# Patient Record
Sex: Female | Born: 1990 | Race: Black or African American | Hispanic: No | Smoking: Former smoker
Health system: Southern US, Community
[De-identification: ages and names within clinical notes are randomized; demographics above are authoritative.]

## PROBLEM LIST (undated history)

## (undated) ENCOUNTER — Inpatient Hospital Stay (HOSPITAL_COMMUNITY): Payer: Self-pay

## (undated) DIAGNOSIS — Z8759 Personal history of other complications of pregnancy, childbirth and the puerperium: Secondary | ICD-10-CM

## (undated) DIAGNOSIS — Z789 Other specified health status: Secondary | ICD-10-CM

## (undated) HISTORY — PX: THERAPEUTIC ABORTION: SHX798

## (undated) HISTORY — DX: Personal history of other complications of pregnancy, childbirth and the puerperium: Z87.59

---
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
## 2012-04-20 ENCOUNTER — Inpatient Hospital Stay (HOSPITAL_COMMUNITY)
Admission: AD | Admit: 2012-04-20 | Discharge: 2012-04-20 | Disposition: A | Discharge status: Home / Self Care | Payer: Medicaid Other | Source: Ambulatory Visit | Attending: Obstetrics | Admitting: Obstetrics

## 2012-04-20 DIAGNOSIS — R109 Unspecified abdominal pain: Secondary | ICD-10-CM | POA: Insufficient documentation

## 2012-04-20 DIAGNOSIS — O4690 Antepartum hemorrhage, unspecified, unspecified trimester: Secondary | ICD-10-CM | POA: Insufficient documentation

## 2012-04-20 LAB — URINALYSIS, ROUTINE W REFLEX MICROSCOPIC
Glucose, UA: NEGATIVE mg/dL
Nitrite: NEGATIVE
Protein, ur: NEGATIVE mg/dL
pH: 7 (ref 5.0–8.0)

## 2012-04-20 LAB — URINE MICROSCOPIC-ADD ON

## 2012-04-20 NOTE — MAU Note (Signed)
Pt reprots having pain and cramping in her back and now it is in her abd Pain comes and goes. Reports some vaginal bleeding as well

## 2012-04-21 ENCOUNTER — Encounter (HOSPITAL_COMMUNITY): Payer: Self-pay | Admitting: Anesthesiology

## 2012-04-21 ENCOUNTER — Inpatient Hospital Stay (HOSPITAL_COMMUNITY): Payer: Medicaid Other | Admitting: Anesthesiology

## 2012-04-21 ENCOUNTER — Encounter (HOSPITAL_COMMUNITY): Payer: Self-pay | Admitting: Obstetrics

## 2012-04-21 ENCOUNTER — Encounter (HOSPITAL_COMMUNITY): Payer: Self-pay | Admitting: *Deleted

## 2012-04-21 ENCOUNTER — Inpatient Hospital Stay (HOSPITAL_COMMUNITY)
Admission: AD | Admit: 2012-04-21 | Discharge: 2012-04-23 | DRG: 775 | Disposition: A | Discharge status: Home / Self Care | Payer: Medicaid Other | Source: Ambulatory Visit | Attending: Obstetrics | Admitting: Obstetrics

## 2012-04-21 LAB — COMPREHENSIVE METABOLIC PANEL
CO2: 22 mEq/L (ref 19–32)
Calcium: 8.8 mg/dL (ref 8.4–10.5)
Creatinine, Ser: 0.55 mg/dL (ref 0.50–1.10)
GFR calc Af Amer: >90 mL/min (ref >90)
GFR calc non Af Amer: >90 mL/min (ref >90)
Glucose, Bld: 116 mg/dL — ABNORMAL HIGH (ref 70–99)
Total Protein: 5.9 g/dL — ABNORMAL LOW (ref 6.0–8.3)

## 2012-04-21 LAB — CBC
HCT: 33.5 % — ABNORMAL LOW (ref 36.0–46.0)
HCT: 33.9 % — ABNORMAL LOW (ref 36.0–46.0)
Hemoglobin: 11.4 g/dL — ABNORMAL LOW (ref 12.0–15.0)
Hemoglobin: 11.2 g/dL — ABNORMAL LOW (ref 12.0–15.0)
MCH: 29.5 pg (ref 26.0–34.0)
MCH: 28.7 pg (ref 26.0–34.0)
MCHC: 34 g/dL (ref 30.0–36.0)
MCHC: 33 g/dL (ref 30.0–36.0)
MCV: 86.8 fL (ref 78.0–100.0)
MCV: 86.9 fL (ref 78.0–100.0)
Platelets: 220 10*3/uL (ref 150–400)
Platelets: 213 10*3/uL (ref 150–400)
RBC: 3.86 MIL/uL — ABNORMAL LOW (ref 3.87–5.11)
RBC: 3.9 MIL/uL (ref 3.87–5.11)
RDW: 13.4 % (ref 11.5–15.5)
RDW: 13.4 % (ref 11.5–15.5)
WBC: 12.1 10*3/uL — ABNORMAL HIGH (ref 4.0–10.5)
WBC: 12.8 10*3/uL — ABNORMAL HIGH (ref 4.0–10.5)

## 2012-04-21 LAB — URIC ACID: Uric Acid, Serum: 3.7 mg/dL (ref 2.4–7.0)

## 2012-04-21 LAB — RPR: RPR Ser Ql: NONREACTIVE

## 2012-04-21 MED ORDER — EPHEDRINE 5 MG/ML INJ
10.0000 mg | INTRAVENOUS | Status: DC | PRN
  Filled 2012-04-21: qty 2
  Filled 2012-04-21: qty 4

## 2012-04-21 MED ORDER — EPHEDRINE 5 MG/ML INJ
10.0000 mg | INTRAVENOUS | Status: DC | PRN
  Filled 2012-04-21: qty 2

## 2012-04-21 MED ORDER — OXYCODONE-ACETAMINOPHEN 5-325 MG PO TABS: 1.0000 | ORAL_TABLET | ORAL | Status: DC | PRN

## 2012-04-21 MED ORDER — DIPHENHYDRAMINE HCL 50 MG/ML IJ SOLN: 12.5000 mg | INTRAMUSCULAR | Status: DC | PRN

## 2012-04-21 MED ORDER — WITCH HAZEL-GLYCERIN EX PADS: 1.0000 "application " | MEDICATED_PAD | CUTANEOUS | Status: DC | PRN

## 2012-04-21 MED ORDER — ONDANSETRON HCL 4 MG/2ML IJ SOLN: 4.0000 mg | INTRAMUSCULAR | Status: DC | PRN

## 2012-04-21 MED ORDER — ONDANSETRON HCL 4 MG PO TABS: 4.0000 mg | ORAL_TABLET | ORAL | Status: DC | PRN

## 2012-04-21 MED ORDER — LACTATED RINGERS IV SOLN
INTRAVENOUS | Status: DC
  Administered 2012-04-21 (×2): via INTRAVENOUS

## 2012-04-21 MED ORDER — BENZOCAINE-MENTHOL 20-0.5 % EX AERO
1.0000 "application " | INHALATION_SPRAY | CUTANEOUS | Status: DC | PRN
  Filled 2012-04-21: qty 56

## 2012-04-21 MED ORDER — MEDROXYPROGESTERONE ACETATE 150 MG/ML IM SUSP: 150.0000 mg | INTRAMUSCULAR | Status: DC | PRN

## 2012-04-21 MED ORDER — LIDOCAINE HCL (PF) 1 % IJ SOLN
30.0000 mL | INTRAMUSCULAR | Status: DC | PRN
  Filled 2012-04-21 (×2): qty 30

## 2012-04-21 MED ORDER — ZOLPIDEM TARTRATE 5 MG PO TABS: 5.0000 mg | ORAL_TABLET | Freq: Every evening | ORAL | Status: DC | PRN

## 2012-04-21 MED ORDER — LANOLIN HYDROUS EX OINT: TOPICAL_OINTMENT | CUTANEOUS | Status: DC | PRN

## 2012-04-21 MED ORDER — SENNOSIDES-DOCUSATE SODIUM 8.6-50 MG PO TABS
2.0000 | ORAL_TABLET | Freq: Every day | ORAL | Status: DC
  Administered 2012-04-21 – 2012-04-22 (×2): 2 via ORAL

## 2012-04-21 MED ORDER — SIMETHICONE 80 MG PO CHEW: 80.0000 mg | CHEWABLE_TABLET | ORAL | Status: DC | PRN

## 2012-04-21 MED ORDER — IBUPROFEN 600 MG PO TABS
600.0000 mg | ORAL_TABLET | Freq: Four times a day (QID) | ORAL | Status: DC | PRN
  Administered 2012-04-21: 600 mg via ORAL
  Filled 2012-04-21: qty 1

## 2012-04-21 MED ORDER — OXYTOCIN 40 UNITS IN LACTATED RINGERS INFUSION - SIMPLE MED
62.5000 mL/h | INTRAVENOUS | Status: DC
  Filled 2012-04-21: qty 1000

## 2012-04-21 MED ORDER — PRENATAL MULTIVITAMIN CH
1.0000 | ORAL_TABLET | Freq: Every day | ORAL | Status: DC
  Administered 2012-04-22 – 2012-04-23 (×2): 1 via ORAL
  Filled 2012-04-21 (×2): qty 1

## 2012-04-21 MED ORDER — IBUPROFEN 600 MG PO TABS
600.0000 mg | ORAL_TABLET | Freq: Four times a day (QID) | ORAL | Status: DC
  Administered 2012-04-21 – 2012-04-23 (×8): 600 mg via ORAL
  Filled 2012-04-21 (×8): qty 1

## 2012-04-21 MED ORDER — PHENYLEPHRINE 40 MCG/ML (10ML) SYRINGE FOR IV PUSH (FOR BLOOD PRESSURE SUPPORT)
80.0000 ug | PREFILLED_SYRINGE | INTRAVENOUS | Status: DC | PRN
  Filled 2012-04-21: qty 2

## 2012-04-21 MED ORDER — OXYTOCIN 40 UNITS IN LACTATED RINGERS INFUSION - SIMPLE MED: 62.5000 mL/h | INTRAVENOUS | Status: DC | PRN

## 2012-04-21 MED ORDER — DIPHENHYDRAMINE HCL 25 MG PO CAPS: 25.0000 mg | ORAL_CAPSULE | Freq: Four times a day (QID) | ORAL | Status: DC | PRN

## 2012-04-21 MED ORDER — PHENYLEPHRINE 40 MCG/ML (10ML) SYRINGE FOR IV PUSH (FOR BLOOD PRESSURE SUPPORT)
80.0000 ug | PREFILLED_SYRINGE | INTRAVENOUS | Status: DC | PRN
  Filled 2012-04-21: qty 5
  Filled 2012-04-21: qty 2

## 2012-04-21 MED ORDER — FENTANYL 2.5 MCG/ML BUPIVACAINE 1/10 % EPIDURAL INFUSION (WH - ANES)
INTRAMUSCULAR | Status: DC | PRN
  Administered 2012-04-21: 14 mL/h via EPIDURAL

## 2012-04-21 MED ORDER — CITRIC ACID-SODIUM CITRATE 334-500 MG/5ML PO SOLN: 30.0000 mL | ORAL | Status: DC | PRN

## 2012-04-21 MED ORDER — ACETAMINOPHEN 325 MG PO TABS: 650.0000 mg | ORAL_TABLET | ORAL | Status: DC | PRN

## 2012-04-21 MED ORDER — DIBUCAINE 1 % RE OINT: 1.0000 "application " | TOPICAL_OINTMENT | RECTAL | Status: DC | PRN

## 2012-04-21 MED ORDER — ONDANSETRON HCL 4 MG/2ML IJ SOLN: 4.0000 mg | Freq: Four times a day (QID) | INTRAMUSCULAR | Status: DC | PRN

## 2012-04-21 MED ORDER — LACTATED RINGERS IV SOLN
500.0000 mL | Freq: Once | INTRAVENOUS | Status: AC
  Administered 2012-04-21: 500 mL via INTRAVENOUS

## 2012-04-21 MED ORDER — OXYTOCIN BOLUS FROM INFUSION
500.0000 mL | INTRAVENOUS | Status: DC
  Administered 2012-04-21: 500 mL via INTRAVENOUS

## 2012-04-21 MED ORDER — TETANUS-DIPHTH-ACELL PERTUSSIS 5-2.5-18.5 LF-MCG/0.5 IM SUSP: 0.5000 mL | Freq: Once | INTRAMUSCULAR | Status: DC

## 2012-04-21 MED ORDER — LACTATED RINGERS IV SOLN
500.0000 mL | INTRAVENOUS | Status: DC | PRN
  Administered 2012-04-21: 300 mL via INTRAVENOUS

## 2012-04-21 MED ORDER — LIDOCAINE HCL (PF) 1 % IJ SOLN
INTRAMUSCULAR | Status: DC | PRN
  Administered 2012-04-21 (×2): 9 mL

## 2012-04-21 MED ORDER — FENTANYL 2.5 MCG/ML BUPIVACAINE 1/10 % EPIDURAL INFUSION (WH - ANES)
14.0000 mL/h | INTRAMUSCULAR | Status: DC
  Filled 2012-04-21 (×2): qty 125

## 2012-04-21 MED ORDER — BUTORPHANOL TARTRATE 1 MG/ML IJ SOLN: 1.0000 mg | INTRAMUSCULAR | Status: DC | PRN

## 2012-04-21 MED ORDER — OXYCODONE-ACETAMINOPHEN 5-325 MG PO TABS
1.0000 | ORAL_TABLET | ORAL | Status: DC | PRN
  Administered 2012-04-21 – 2012-04-23 (×5): 1 via ORAL
  Filled 2012-04-21 (×5): qty 1

## 2012-04-21 NOTE — Anesthesia Postprocedure Evaluation (Signed)
  Anesthesia Post-op Note  Patient: Elizabeth [name]  Procedure(s) Performed: * No procedures listed *  Patient Location: PACU and Mother/Baby  Anesthesia Type:Epidural  Level of Consciousness: awake, alert  and oriented  Airway and Oxygen Therapy: Patient Spontanous Breathing  Post-op Pain: none  Post-op Assessment: Post-op Vital signs reviewed and Patient's Cardiovascular Status Stable  Post-op Vital Signs: Reviewed and stable  Complications: No apparent anesthesia complications

## 2012-04-21 NOTE — MAU Note (Signed)
Contractions, pt per w/c from [employer]

## 2012-04-21 NOTE — Progress Notes (Signed)
Elizabeth [name] is a 21 y.o. G1P0000 at [redacted]w[redacted]d by LMP admitted for active labor  Subjective:   Objective: BP 110/55  Pulse 92  Temp 98 F (36.7 C) (Oral)  Resp 20  Ht 5\' 4"  (1.626 m)  Wt 72.576 kg (160 lb)  BMI 27.46 kg/m2  SpO2 96%  LMP 07/14/2011   Total I/O In: -  Out: 50 [Urine:50]  FHT:  FHR: 150-160 bpm, variability: moderate,  accelerations:  Present,  decelerations:  Absent UC:   regular, every 3 minutes SVE:   Dilation: Lip/rim Effacement (%): 100 Station: 0 Exam by:: Clearance Coots, MD AROM:  Clear Labs: Lab Results  Component Value Date   WBC 12.1* 04/21/2012   HGB 11.4* 04/21/2012   HCT 33.5* 04/21/2012   MCV 86.8 04/21/2012   PLT 220 04/21/2012    Assessment / Plan: Spontaneous labor, progressing normally  Labor: Progressing normally Preeclampsia:  n/a` Fetal Wellbeing:  Category I Pain Control:  Epidural I/D:  n/a Anticipated MOD:  NSVD  HARPER,CHARLES A 04/21/2012, 9:25 AM

## 2012-04-21 NOTE — Anesthesia Procedure Notes (Signed)
Epidural Patient location during procedure: OB Start time: 04/21/2012 7:26 AM End time: 04/21/2012 7:30 AM  Staffing Anesthesiologist: Sandrea Hughs Performed by: anesthesiologist   Preanesthetic Checklist Completed: patient identified, site marked, surgical consent, pre-op evaluation, timeout performed, IV checked, risks and benefits discussed and monitors and equipment checked  Epidural Patient position: sitting Prep: site prepped and draped and DuraPrep Patient monitoring: continuous pulse ox and blood pressure Approach: midline Injection technique: LOR air  Needle:  Needle type: Tuohy  Needle gauge: 17 G Needle length: 9 cm and 9 Needle insertion depth: 6 cm Catheter type: closed end flexible Catheter size: 19 Gauge Catheter at skin depth: 11 cm Test dose: negative and Other  Assessment Sensory level: T8 Events: blood not aspirated, injection not painful, no injection resistance, negative IV test and no paresthesia  Additional Notes Reason for block:procedure for pain

## 2012-04-21 NOTE — Anesthesia Preprocedure Evaluation (Signed)
Anesthesia Evaluation  Patient identified by MRN, date of birth, ID band Patient awake    Reviewed: Allergy & Precautions, H&P , NPO status , Patient's Chart, lab work & pertinent test results  Airway Mallampati: I TM Distance: >3 FB Neck ROM: full    Dental No notable dental hx.    Pulmonary neg pulmonary ROS,    Pulmonary exam normal       Cardiovascular negative cardio ROS      Neuro/Psych negative neurological ROS  negative psych ROS   GI/Hepatic negative GI ROS, Neg liver ROS,   Endo/Other  negative endocrine ROS  Renal/GU negative Renal ROS  negative genitourinary   Musculoskeletal negative musculoskeletal ROS (+)   Abdominal Normal abdominal exam  (+)   Peds negative pediatric ROS (+)  Hematology negative hematology ROS (+)   Anesthesia Other Findings   Reproductive/Obstetrics (+) Pregnancy                           Anesthesia Physical Anesthesia Plan  ASA: II  Anesthesia Plan: Epidural   Post-op Pain Management:    Induction:   Airway Management Planned:   Additional Equipment:   Intra-op Plan:   Post-operative Plan:   Informed Consent: I have reviewed the patients History and Physical, chart, labs and discussed the procedure including the risks, benefits and alternatives for the proposed anesthesia with the patient or authorized representative who has indicated his/her understanding and acceptance.     Plan Discussed with: CRNA and Surgeon  Anesthesia Plan Comments:         Anesthesia Quick Evaluation

## 2012-04-21 NOTE — H&P (Signed)
Elizabeth [name] is a 21 y.o. female presenting for UC's. Maternal Medical History:  Reason for admission: Reason for admission: contractions.  21 yo G1.  EDC 04-25-12.  Presents with UC's.  GBS -.  Contractions: Frequency: regular.    Fetal activity: Perceived fetal activity is normal.    Prenatal complications: no prenatal complications Prenatal Complications - Diabetes: none.    OB History    Grav Para Term Preterm Abortions TAB SAB Ect Mult Living   1 0 0 0 0 0 0 0 0 0      Past Medical History  Diagnosis Date  . No pertinent past medical history    Past Surgical History  Procedure Date  . No past surgeries    Family History: family history is negative for Anesthesia problems. Social History:  reports that she has never smoked. She has never used smokeless tobacco. She reports that she does not drink alcohol or use illicit drugs.   Prenatal Transfer Tool  Maternal Diabetes: No Genetic Screening: Normal Maternal Ultrasounds/Referrals: Normal Fetal Ultrasounds or other Referrals:  None Maternal Substance Abuse:  No Significant Maternal Medications:  Meds include: Other:  Significant Maternal Lab Results:  Lab values include: Group B Strep negative Other Comments:  None  Review of Systems  All other systems reviewed and are negative.    Dilation: 7 Effacement (%): 100 Station: -1 Exam by:: Renaldo Harrison, RN Blood pressure 137/93, pulse 90, temperature 98 F (36.7 C), temperature source Oral, resp. rate 20, height 5\' 4"  (1.626 m), weight 72.576 kg (160 lb), last menstrual period 07/14/2011, SpO2 100.00%. Maternal Exam:  Uterine Assessment: Contraction strength is firm.  Contraction frequency is regular.   Abdomen: Patient reports no abdominal tenderness. Fetal presentation: vertex  Pelvis: adequate for delivery.   Cervix: Cervix evaluated by digital exam.     Physical Exam  Nursing note and vitals reviewed. Constitutional: She is oriented to person, place, and time.  She appears well-developed and well-nourished.  HENT:  Head: Normocephalic and atraumatic.  Eyes: Conjunctivae normal are normal. Pupils are equal, round, and reactive to light.  Neck: Normal range of motion. Neck supple.  Cardiovascular: Normal rate and regular rhythm.   Respiratory: Effort normal.  GI: Soft.  Musculoskeletal: Normal range of motion.  Neurological: She is alert and oriented to person, place, and time.  Skin: Skin is warm and dry.  Psychiatric: She has a normal mood and affect. Her behavior is normal. Judgment and thought content normal.    Prenatal labs: ABO, Rh: --/--/B POS (03/13 2110) Antibody:   Rubella:   RPR:    HBsAg:    HIV:    GBS: Negative (10/16 0000)   Assessment/Plan: 39 weeks.  Active labor.  Expectant management.   HARPER,CHARLES A 04/21/2012, 8:21 AM

## 2012-04-21 NOTE — Progress Notes (Signed)
Elizabeth [name] is a 21 y.o. G1P0000 at [redacted]w[redacted]d by LMP admitted for active labor  Subjective:   Objective: BP 137/93  Pulse 90  Temp 98 F (36.7 C) (Oral)  Resp 20  Ht 5\' 4"  (1.626 m)  Wt 72.576 kg (160 lb)  BMI 27.46 kg/m2  SpO2 100%  LMP 07/14/2011      FHT:  FHR: 150 bpm, variability: moderate,  accelerations:  Present,  decelerations:  Absent UC:   regular, every 3-4 minutes SVE:   Dilation: 7 Effacement (%): 100 Station: -1 Exam by:: Renaldo Harrison, RN  Labs: Lab Results  Component Value Date   WBC 12.1* 04/21/2012   HGB 11.4* 04/21/2012   HCT 33.5* 04/21/2012   MCV 86.8 04/21/2012   PLT 220 04/21/2012    Assessment / Plan: Spontaneous labor, progressing normally  Labor: Progressing normally Preeclampsia:  n/a Fetal Wellbeing:  Category I Pain Control:  Epidural I/D:  n/a Anticipated MOD:  NSVD  HARPER,CHARLES A 04/21/2012, 8:27 AM

## 2012-04-22 LAB — CBC
HCT: 29.8 % — ABNORMAL LOW (ref 36.0–46.0)
Hemoglobin: 9.7 g/dL — ABNORMAL LOW (ref 12.0–15.0)
MCH: 28.5 pg (ref 26.0–34.0)
MCHC: 32.6 g/dL (ref 30.0–36.0)
MCV: 87.6 fL (ref 78.0–100.0)
RDW: 13.4 % (ref 11.5–15.5)

## 2012-04-22 LAB — URINE CULTURE: Culture: NO GROWTH

## 2012-04-22 MED ORDER — INFLUENZA VIRUS VACC SPLIT PF IM SUSP
0.5000 mL | Freq: Once | INTRAMUSCULAR | Status: AC
  Administered 2012-04-22: 0.5 mL via INTRAMUSCULAR
  Filled 2012-04-22: qty 0.5

## 2012-04-22 NOTE — Progress Notes (Signed)
CM / UR chart review completed.  

## 2012-04-22 NOTE — Progress Notes (Signed)
Post Partum Day 1 Subjective: no complaints  Objective: Blood pressure 117/60, pulse 96, temperature 98.4 F (36.9 C), temperature source Oral, resp. rate 18, height 5\' 4"  (1.626 m), weight 72.576 kg (160 lb), last menstrual period 07/14/2011, SpO2 98.00%, unknown if currently breastfeeding.  Physical Exam:  General: alert and no distress Lochia: appropriate Uterine Fundus: firm Incision: none DVT Evaluation: No evidence of DVT seen on physical exam.   Basename 04/21/12 1140 04/21/12 0645  HGB 11.2* 11.4*  HCT 33.9* 33.5*    Assessment/Plan: Plan for discharge tomorrow   LOS: 1 day   Ahna Konkle A 04/22/2012, 5:22 AM

## 2012-04-23 MED ORDER — IBUPROFEN 600 MG PO TABS: 600.0000 mg | ORAL_TABLET | Freq: Four times a day (QID) | ORAL | Status: DC

## 2012-04-23 MED ORDER — OXYCODONE-ACETAMINOPHEN 5-325 MG PO TABS: 1.0000 | ORAL_TABLET | ORAL | Status: DC | PRN

## 2012-04-23 NOTE — Progress Notes (Signed)
Post Partum Day 2 Subjective: no complaints  Objective: Blood pressure 122/66, pulse 97, temperature 98.2 F (36.8 C), temperature source Oral, resp. rate 18, height 5\' 4"  (1.626 m), weight 72.576 kg (160 lb), last menstrual period 07/14/2011, SpO2 98.00%, unknown if currently breastfeeding.  Physical Exam:  General: alert and no distress Lochia: appropriate Uterine Fundus: firm Incision: none DVT Evaluation: No evidence of DVT seen on physical exam.   Basename 04/22/12 0525 04/21/12 1140  HGB 9.7* 11.2*  HCT 29.8* 33.9*    Assessment/Plan: Discharge home   LOS: 2 days   Arleth Mccullar A 04/23/2012, 5:46 AM

## 2012-04-23 NOTE — Discharge Summary (Signed)
Obstetric Discharge Summary Reason for Admission: onset of labor Prenatal Procedures: ultrasound Intrapartum Procedures: spontaneous vaginal delivery Postpartum Procedures: none Complications-Operative and Postpartum: none Hemoglobin  Date Value Range Status  04/22/2012 9.7* 12.0 - 15.0 g/dL Final     HCT  Date Value Range Status  04/22/2012 29.8* 36.0 - 46.0 % Final    Physical Exam:  General: alert and no distress Lochia: appropriate Uterine Fundus: firm Incision: none DVT Evaluation: No evidence of DVT seen on physical exam.  Discharge Diagnoses: Term Pregnancy-delivered  Discharge Information: Date: 04/23/2012 Activity: pelvic rest Diet: routine Medications: PNV, Ibuprofen, Colace and Percocet Condition: stable Instructions: refer to practice specific booklet Discharge to: home Follow-up Information    Follow up with [name], MD. Schedule an appointment as soon as possible for [name] visit in 6 weeks.   Contact information:   [address], [address] [place] [address] [phone]          Newborn Data: Live born female  Birth Weight: 6 lb (2721 g) APGAR: 8, 9  Home with mother.  Elizabeth [name] 04/23/2012, 5:54 AM

## 2013-06-06 ENCOUNTER — Encounter (HOSPITAL_COMMUNITY): Payer: Self-pay | Admitting: Emergency Medicine

## 2013-06-06 ENCOUNTER — Emergency Department (INDEPENDENT_AMBULATORY_CARE_PROVIDER_SITE_OTHER)
Admission: EM | Admit: 2013-06-06 | Discharge: 2013-06-06 | Disposition: A | Discharge status: Home / Self Care | Payer: Medicaid Other | Source: Home / Self Care | Attending: Family Medicine | Admitting: Family Medicine

## 2013-06-06 DIAGNOSIS — R51 Headache: Secondary | ICD-10-CM

## 2013-06-06 DIAGNOSIS — N390 Urinary tract infection, site not specified: Secondary | ICD-10-CM

## 2013-06-06 LAB — POCT URINALYSIS DIP (DEVICE)
Bilirubin Urine: NEGATIVE
Glucose, UA: NEGATIVE mg/dL
Hgb urine dipstick: NEGATIVE
Ketones, ur: 40 mg/dL — AB
Nitrite: NEGATIVE
pH: 7 (ref 5.0–8.0)

## 2013-06-06 MED ORDER — DICLOFENAC SODIUM 50 MG PO TBEC: 50.0000 mg | DELAYED_RELEASE_TABLET | Freq: Two times a day (BID) | ORAL | Status: DC | PRN

## 2013-06-06 MED ORDER — CEPHALEXIN 500 MG PO CAPS: 500.0000 mg | ORAL_CAPSULE | Freq: Two times a day (BID) | ORAL | Status: DC

## 2013-06-06 NOTE — ED Notes (Signed)
C/o headache and uti States when she urinates she feels urgency Hurts when urinating Little blood in urine  States headache today around 2 Ibuprofen was taking  Did have some vision change Denies weakness, numbness

## 2013-06-06 NOTE — ED Provider Notes (Signed)
Elizabeth [name] is a 22 y.o. female who presents to Urgent Care today for headache and UTI. 1) headache: Patient developed a moderate migraine type headache today at work. She noted a visual aura. She took 200 mg of ibuprofen which helped a lot. She is much improved. No weakness or numbness. No difficulty walking or bowel bladder dysfunction.   2) UTI. Patient notes several days of urinary frequency urgency and dysuria. She's tried Cranberry juice pill which helped. The symptoms are consistent with prior urinary tract infection. No nausea vomiting diarrhea or abdominal pain.   Past Medical History  Diagnosis Date  . No pertinent past medical history    History  Substance Use Topics  . Smoking status: Never Smoker   . Smokeless tobacco: Never Used  . Alcohol Use: No   ROS as above Medications reviewed. No current facility-administered medications for this encounter.   Current Outpatient Prescriptions  Medication Sig Dispense Refill  . cephALEXin (KEFLEX) 500 MG capsule Take 1 capsule (500 mg total) by mouth 2 (two) times daily.  14 capsule  0  . diclofenac (VOLTAREN) 50 MG EC tablet Take 1 tablet (50 mg total) by mouth 2 (two) times daily as needed for moderate pain.  30 tablet  0  . ibuprofen (ADVIL,MOTRIN) 600 MG tablet Take 1 tablet (600 mg total) by mouth every 6 (six) hours.  30 tablet  5  . Prenatal Vit-Fe Fumarate-FA (PRENATAL MULTIVITAMIN) TABS Take 1 tablet by mouth every morning.      . [DISCONTINUED] IRON PO Take 1 tablet by mouth daily.          Exam:  BP 119/77  Pulse 88  Temp(Src) 98.4 F (36.9 C) (Oral)  Resp 18  SpO2 100%  LMP 05/29/2013  Breastfeeding? No Gen: Well NAD HEENT: EOMI,  MMM, PERRLA Lungs: Normal work of breathing. CTABL Heart: RRR no MRG Abd: NABS, Soft. NT, ND, no CV angle tenderness to percussion.  Exts: Non edematous BL  LE, warm and well perfused.  Neuro: AOx3 CN 2-12 intact. Normal coordination balance strength and sensation.    Results for  orders placed during the hospital encounter of 06/06/13 (from the past 24 hour(s))  POCT URINALYSIS DIP (DEVICE)     Status: Abnormal   Collection Time    06/06/13  5:07 PM      Result Value Range   Glucose, UA NEGATIVE  NEGATIVE mg/dL   Bilirubin Urine NEGATIVE  NEGATIVE   Ketones, ur 40 (*) NEGATIVE mg/dL   Specific Gravity, Urine 1.025  1.005 - 1.030   Hgb urine dipstick NEGATIVE  NEGATIVE   pH 7.0  5.0 - 8.0   Protein, ur 100 (*) NEGATIVE mg/dL   Urobilinogen, UA 0.2  0.0 - 1.0 mg/dL   Nitrite NEGATIVE  NEGATIVE   Leukocytes, UA MODERATE (*) NEGATIVE  POCT PREGNANCY, URINE     Status: None   Collection Time    06/06/13  5:09 PM      Result Value Range   Preg Test, Ur NEGATIVE  NEGATIVE   No results found.  Assessment and Plan: 22 y.o. female with  1) UTI: Plan for management with keflex.  F/u prn  2) Headache: Likely migraine type. Appears to be improving.  Plan to use diclofenac as needed in the future. Followup with primary care provider.   Discussed warning signs or symptoms. Please see discharge instructions. Patient expresses understanding.      Rodolph Bong, MD 06/06/13 [phone]

## 2013-06-28 IMAGING — US US OB COMP LESS 14 WK
1 series · 14 of 26 positions shown · non-contrast
Comparison: 08/21/2011

CLINICAL DATA: Positive pregnancy test.  Fell on stomach.
Abdominal pain.

OBSTETRIC <14 WK ULTRASOUND
TECHNIQUE: Transabdominal ultrasound was performed for evaluation
of the gestation as well as the maternal uterus and adnexal
regions.

[Series 1: us ob comp less 14 wks · 26 acquisitions, 14 frames shown]
[im 1/26]
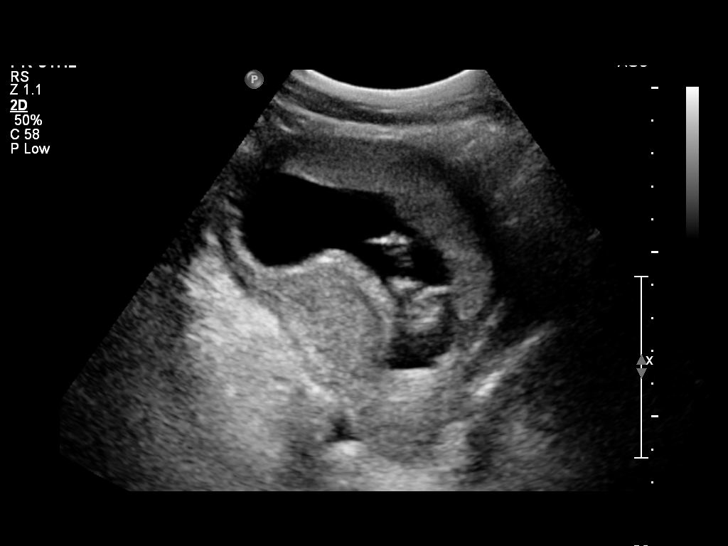
[im 3/26]
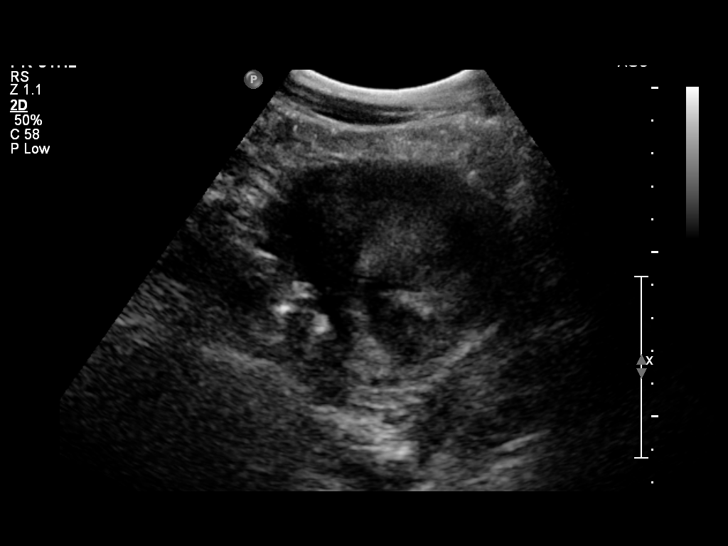
[im 5/26]
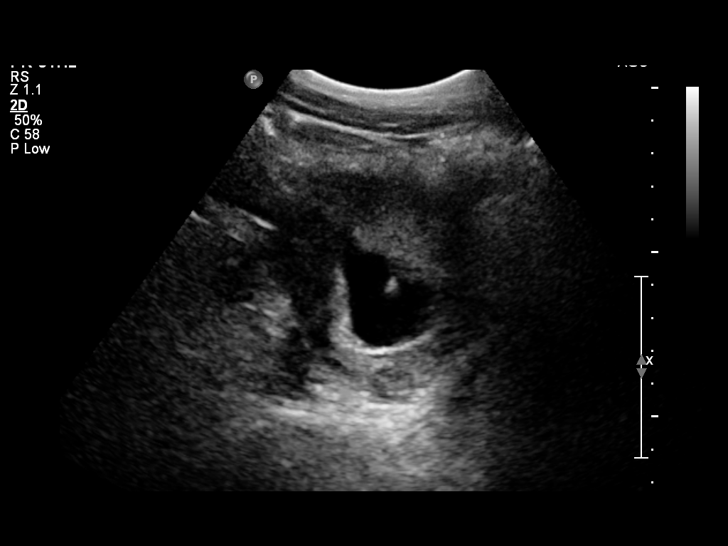
[im 7/26]
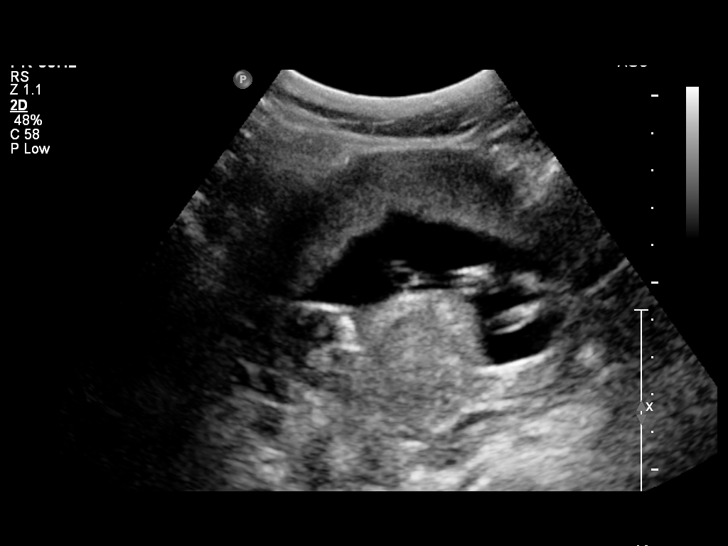
[im 9/26]
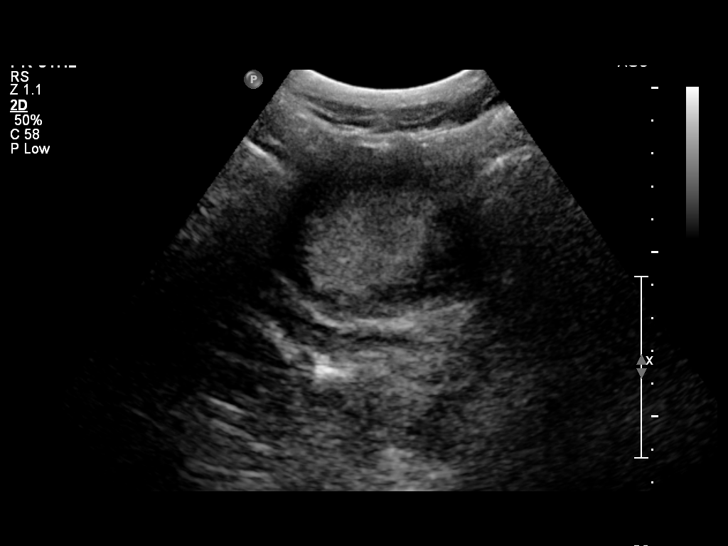
[im 11/26]
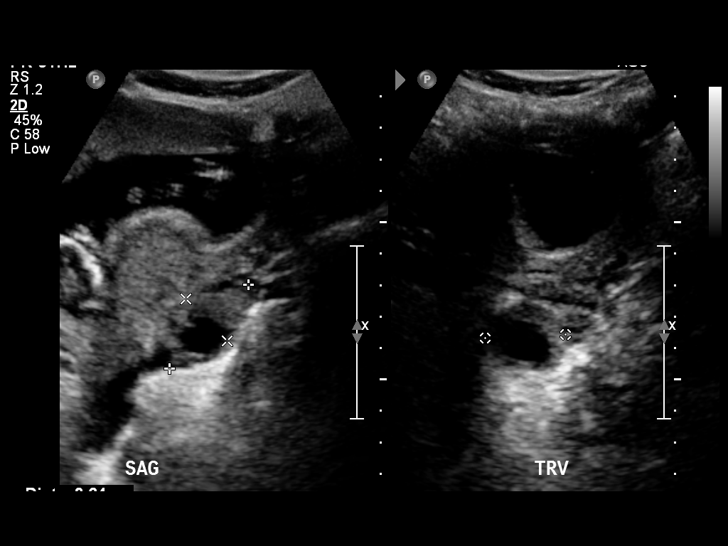
[im 13/26]
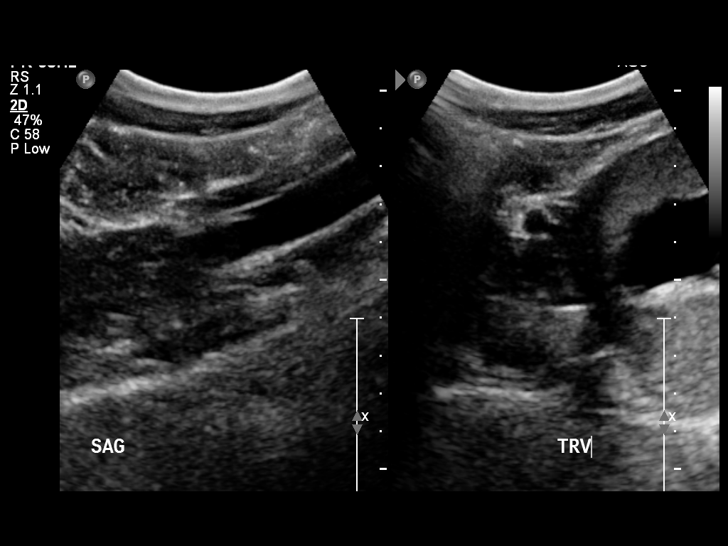
[im 14/26]
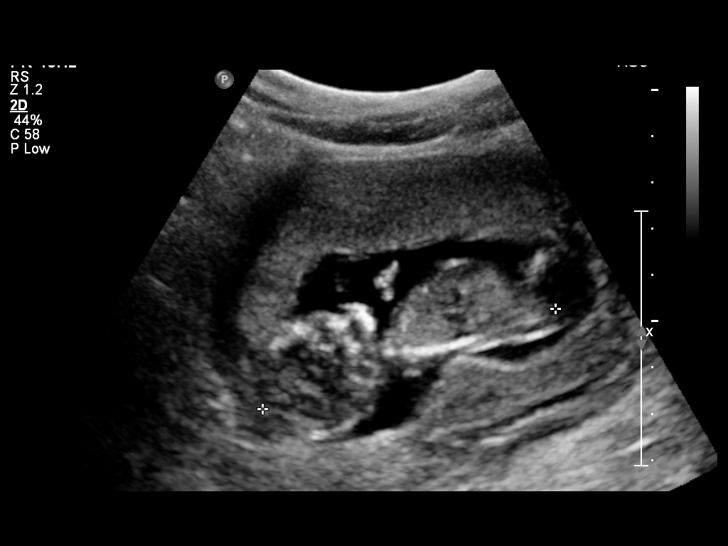
[im 16/26]
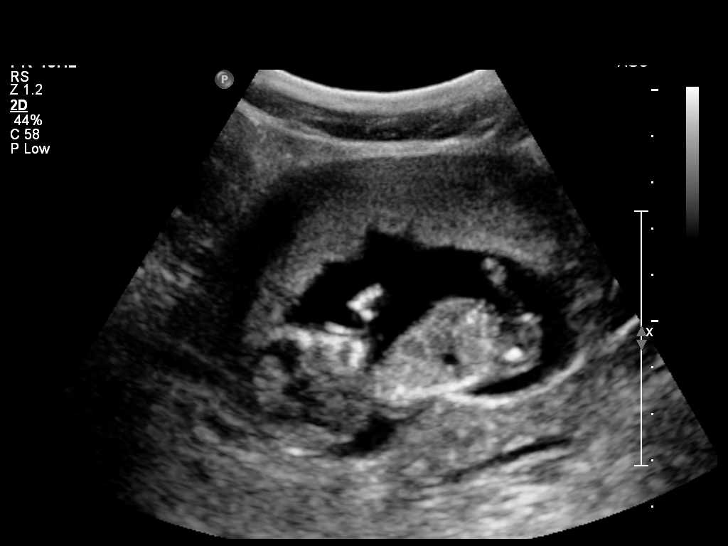
[im 18/26]
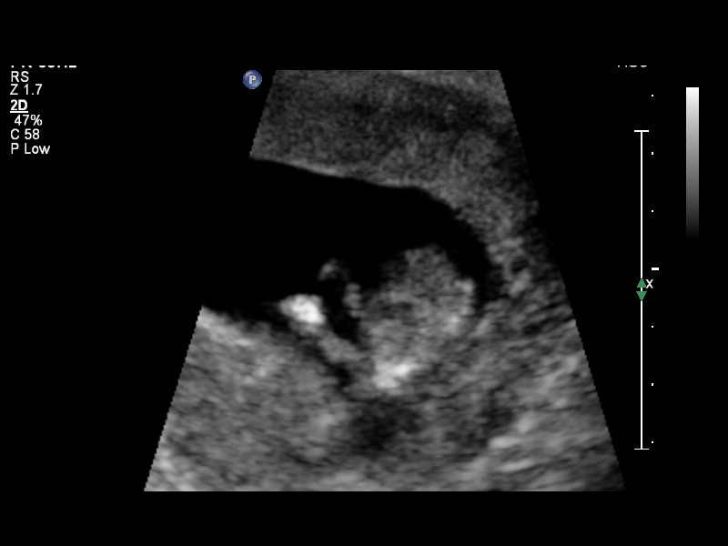
[im 20/26]
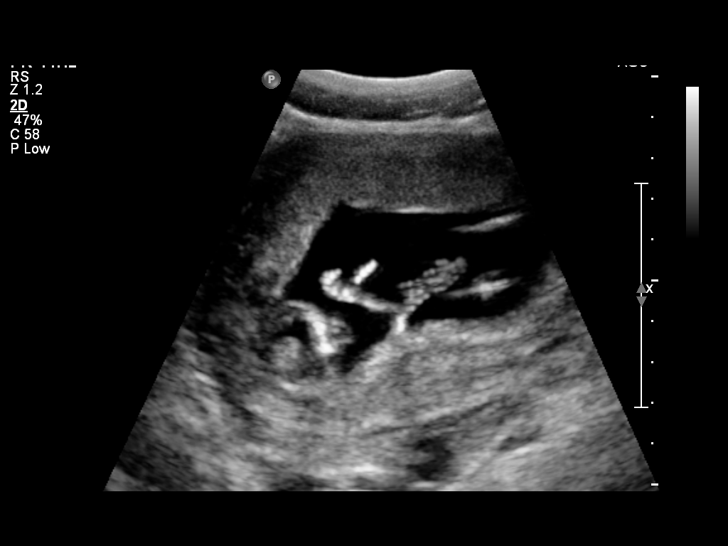
[im 22/26]
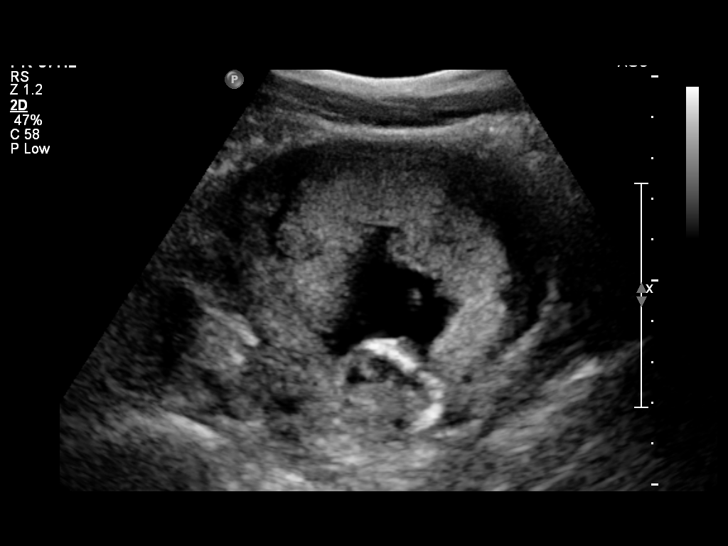
[im 24/26]
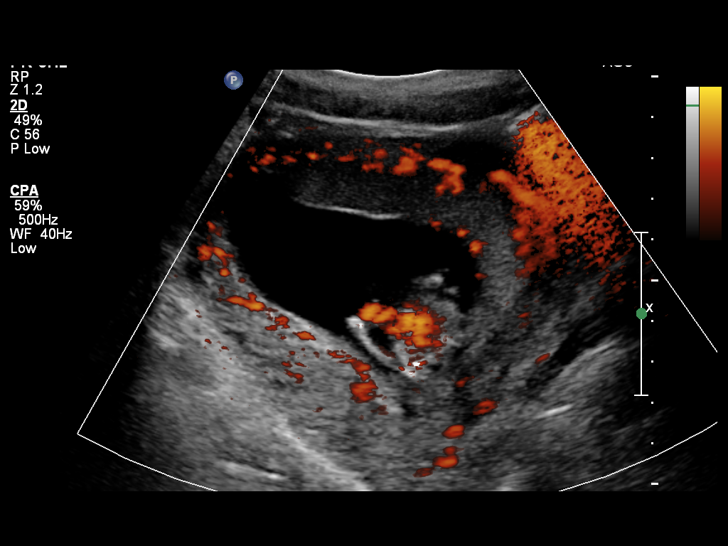
[im 26/26]
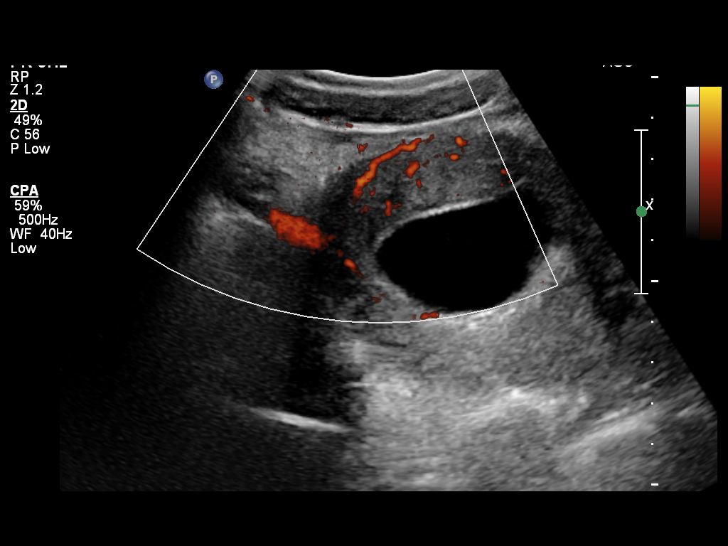

[14 of 26 positions shown; findings below may reference images not displayed]

Intrauterine gestational sac: Single.  Normal shape.
Yolk sac: Not visualized
Embryo: Visualized
Cardiac Activity: Visualized
Heart Rate: 156 bpm

CRL:  6.6 cm 13w  0d          US EDC: 04/24/2012

Maternal uterus/Adnexae:
No evidence for retroplacental fluid collection or hemorrhage.  Of
note, acute placental abruption can be occult on ultrasound..
Maternal right ovary is not visualized.  Maternal left ovary is
unremarkable.  No free fluid in the cul-de-sac.
IMPRESSION: Single living intrauterine gestation at 13-week-6-day gestational
age by crown-rump length.

No placental abnormality although abruption can be occult in the
acute phase at ultrasound.

## 2013-08-12 ENCOUNTER — Emergency Department (HOSPITAL_COMMUNITY)
Admission: EM | Admit: 2013-08-12 | Discharge: 2013-08-12 | Disposition: A | Discharge status: Home / Self Care | Payer: Self-pay | Source: Home / Self Care

## 2013-10-11 ENCOUNTER — Emergency Department (HOSPITAL_COMMUNITY)
Admission: EM | Admit: 2013-10-11 | Discharge: 2013-10-11 | Disposition: A | Discharge status: Home / Self Care | Payer: Self-pay | Attending: Emergency Medicine | Admitting: Emergency Medicine

## 2013-10-11 ENCOUNTER — Emergency Department (HOSPITAL_COMMUNITY): Payer: Self-pay

## 2013-10-11 ENCOUNTER — Encounter (HOSPITAL_COMMUNITY): Payer: Self-pay | Admitting: Emergency Medicine

## 2013-10-11 DIAGNOSIS — R0789 Other chest pain: Secondary | ICD-10-CM

## 2013-10-11 DIAGNOSIS — Y939 Activity, unspecified: Secondary | ICD-10-CM | POA: Insufficient documentation

## 2013-10-11 DIAGNOSIS — IMO0002 Reserved for concepts with insufficient information to code with codable children: Secondary | ICD-10-CM | POA: Insufficient documentation

## 2013-10-11 DIAGNOSIS — S60522A Blister (nonthermal) of left hand, initial encounter: Secondary | ICD-10-CM

## 2013-10-11 DIAGNOSIS — S298XXA Other specified injuries of thorax, initial encounter: Secondary | ICD-10-CM | POA: Insufficient documentation

## 2013-10-11 MED ORDER — IBUPROFEN 400 MG PO TABS
600.0000 mg | ORAL_TABLET | Freq: Once | ORAL | Status: AC
  Administered 2013-10-11: 600 mg via ORAL
  Filled 2013-10-11 (×2): qty 1
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
## 2013-10-11 NOTE — ED Notes (Signed)
Patient transported to X-ray 
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
## 2014-01-28 ENCOUNTER — Inpatient Hospital Stay (HOSPITAL_COMMUNITY): Payer: Self-pay

## 2014-01-28 ENCOUNTER — Encounter (HOSPITAL_COMMUNITY): Payer: Self-pay | Admitting: General Practice

## 2014-01-28 ENCOUNTER — Inpatient Hospital Stay (HOSPITAL_COMMUNITY)
Admission: AD | Admit: 2014-01-28 | Discharge: 2014-01-28 | Disposition: A | Discharge status: Home / Self Care | Payer: Self-pay | Source: Ambulatory Visit | Attending: Obstetrics & Gynecology | Admitting: Obstetrics & Gynecology

## 2014-01-28 DIAGNOSIS — O209 Hemorrhage in early pregnancy, unspecified: Secondary | ICD-10-CM | POA: Insufficient documentation

## 2014-01-28 DIAGNOSIS — Z3491 Encounter for supervision of normal pregnancy, unspecified, first trimester: Secondary | ICD-10-CM

## 2014-01-28 DIAGNOSIS — R109 Unspecified abdominal pain: Secondary | ICD-10-CM | POA: Insufficient documentation

## 2014-01-28 LAB — URINALYSIS, ROUTINE W REFLEX MICROSCOPIC
BILIRUBIN URINE: NEGATIVE
GLUCOSE, UA: NEGATIVE mg/dL
HGB URINE DIPSTICK: NEGATIVE
KETONES UR: NEGATIVE mg/dL
NITRITE: NEGATIVE
PH: 6.5 (ref 5.0–8.0)
Protein, ur: NEGATIVE mg/dL
Specific Gravity, Urine: 1.015 (ref 1.005–1.030)
Urobilinogen, UA: 1 mg/dL (ref 0.0–1.0)

## 2014-01-28 LAB — URINE MICROSCOPIC-ADD ON

## 2014-01-28 LAB — CBC
HCT: 35.9 % — ABNORMAL LOW (ref 36.0–46.0)
Hemoglobin: 12.4 g/dL (ref 12.0–15.0)
MCH: 30 pg (ref 26.0–34.0)
MCHC: 34.5 g/dL (ref 30.0–36.0)
MCV: 86.7 fL (ref 78.0–100.0)
PLATELETS: 287 10*3/uL (ref 150–400)
RBC: 4.14 MIL/uL (ref 3.87–5.11)
RDW: 12.5 % (ref 11.5–15.5)
WBC: 7.7 10*3/uL (ref 4.0–10.5)

## 2014-01-28 LAB — WET PREP, GENITAL
TRICH WET PREP: NONE SEEN
WBC, Wet Prep HPF POC: NONE SEEN
Yeast Wet Prep HPF POC: NONE SEEN

## 2014-01-28 LAB — POCT PREGNANCY, URINE: Preg Test, Ur: POSITIVE — AB

## 2014-01-28 LAB — HCG, QUANTITATIVE, PREGNANCY: HCG, BETA CHAIN, QUANT, S: 42870 m[IU]/mL — AB (ref <5)

## 2014-01-28 MED ORDER — PRENATAL VITAMINS PLUS 27-1 MG PO TABS: 1.0000 | ORAL_TABLET | ORAL | Status: DC

## 2014-01-28 NOTE — MAU Note (Signed)
Patient states she is one day late for her period. Has had burning with urination off and on for about one week. Pink discharge. Having abdominal cramping for 4 days. Nausea with vomiting x 2 this month.

## 2014-01-28 NOTE — Discharge Instructions (Signed)
[facility]    [facility]  [phone]     Phone: [phone]          [facility]                      [facility]  @[place]     Phone: [phone]  Phone: [phone]         [facility]     Phone: [phone]  Phone: [phone]           [facility] [place]                hone: [phone]  Phone: [phone]         [facility]      Phone: [phone]  Phone: [phone]         [facility]                Phone: [phone]  [facility]   [address]Phone:[phone]    [facility] [place]          Phone: [phone] [facility]   Phone: [phone]    ________________________________________     To schedule your Maternity Eligibility Appointment, please call [phone].  When you arrive for your appointment you must bring the following items or information listed below.  Your appointment will be rescheduled if you do not have these items or are 15 minutes late. If currently receiving Medicaid, you MUST bring: 1. Medicaid Card 2. Social Security Card 3. Picture ID 4. Proof of Pregnancy 5. Verification of current address if the address on Medicaid card is incorrect "postmarked mail" If not receiving Medicaid, you MUST bring: 1. Social Security Card 2. Picture ID 3. Birth Certificate (if available) Passport or *Green Card 4. Proof of Pregnancy 5. Verification of current address "postmarked mail" for each income presented. 6. Verification of insurance coverage, if any 7. Check stubs from each employer for the previous month (if unable to present check stub  for each week, we will accept check stub for the first and last week ill the same month.) If you can't locate check stubs, you must bring a letter from the  employer(s) and it must have the following information on letterhead, typed, in English: o name of company o company telephone number o how long been with the company, if less than one month o how much person earns per hour o how many hours per week work o the gross pay the person earned for the previous month If you are 21 years old or less, you do not have to bring proof of income unless you work or live with the father of the baby and at that time we will need proof of income from you and/or the father of the baby. Green Card recipients are eligible for Medicaid for Pregnant Women (MPW)

## 2014-01-28 NOTE — MAU Provider Note (Signed)
Chief Complaint  Patient presents with  . Possible Pregnancy  . Dysuria  . Abdominal Cramping    Subjective Elizabeth [name] 23 y.o.  G2P1001 at 4299w1d by LMP presents with onset 2 days ago of first episode of small amount pink vaginal bleeding when she saw blood in commode and with wiping but none seen today. Has had menstrual-like crampy lower abdominal pain for 4 days. Denies irritative vaginal discharge. Has had dysuria intermittently for a wk.; denies urgency frequency, hematuria. Blood type: B pos  Pregnancy course: NPC. No subjective sx pregnancy  Pertinent Medical History: Pertinent Ob/Gyn History: SVD x1 Pertinent Surgical History: none Pertinent Social History: nonsmoker  No prescriptions prior to admission    No Known Allergies   Objective   Filed Vitals:   01/28/14 1822  BP: 122/80  Pulse: 88  Temp: 99 F (37.2 C)  Resp: 16     Physical Exam General: WN/WD in NAD  Abdom: soft, NT External genitalia: normal; BUS neg  SSE:no blood noted except brownish on q-tip; cervix with no lesions, appears closed Bimanual: Cervix closed, long; uterus anteverted, NT, slightly enlarged; adnexa nontender, no masses   Lab Results Results for orders placed during the hospital encounter of 01/28/14 (from the past 24 hour(s))  URINALYSIS, ROUTINE W REFLEX MICROSCOPIC     Status: Abnormal   Collection Time    01/28/14  6:25 PM      Result Value Ref Range   Color, Urine YELLOW  YELLOW   APPearance CLEAR  CLEAR   Specific Gravity, Urine 1.015  1.005 - 1.030   pH 6.5  5.0 - 8.0   Glucose, UA NEGATIVE  NEGATIVE mg/dL   Hgb urine dipstick NEGATIVE  NEGATIVE   Bilirubin Urine NEGATIVE  NEGATIVE   Ketones, ur NEGATIVE  NEGATIVE mg/dL   Protein, ur NEGATIVE  NEGATIVE mg/dL   Urobilinogen, UA 1.0  0.0 - 1.0 mg/dL   Nitrite NEGATIVE  NEGATIVE   Leukocytes, UA SMALL (*) NEGATIVE  URINE MICROSCOPIC-ADD ON     Status: Abnormal   Collection Time    01/28/14  6:25 PM      Result Value  Ref Range   Squamous Epithelial / LPF RARE  RARE   WBC, UA 7-10  <3 WBC/hpf   RBC / HPF 3-6  <3 RBC/hpf   Bacteria, UA FEW (*) RARE  POCT PREGNANCY, URINE     Status: Abnormal   Collection Time    01/28/14  6:55 PM      Result Value Ref Range   Preg Test, Ur POSITIVE (*) NEGATIVE    Ultrasound  Prelim: IUP  with CRL c/w 5970w1d, HR 111   Assessment 1. Bleeding in early pregnancy   2. Viable pregnancy, first trimester   G2P1001 at 6470w1d  Plan    GC/CT sent Discharge home with reassurance, preg verification letter, list of providers See AVS for pt education   Medication List         PRENATAL VITAMINS PLUS 27-1 MG Tabs  Take 1 tablet by mouth 1 day or 1 dose.        Follow-up Information   Schedule an appointment as soon as possible for [facility] [place]. (Choose pregnancy care provider from the list given)    Contact information:   [address] [place] [address] [phone]      Elija Mccamish 01/28/2014 7:33 PM

## 2014-01-29 LAB — GC/CHLAMYDIA PROBE AMP
CT Probe RNA: NEGATIVE
GC Probe RNA: NEGATIVE

## 2014-01-31 NOTE — MAU Provider Note (Signed)

## 2014-04-11 ENCOUNTER — Encounter (HOSPITAL_COMMUNITY): Payer: Self-pay | Admitting: General Practice

## 2014-07-12 ENCOUNTER — Inpatient Hospital Stay (HOSPITAL_COMMUNITY)
Admission: AD | Admit: 2014-07-12 | Discharge: 2014-07-12 | Disposition: A | Discharge status: Home / Self Care | Payer: Medicaid Other | Source: Ambulatory Visit | Attending: Family Medicine | Admitting: Family Medicine

## 2014-07-12 ENCOUNTER — Encounter (HOSPITAL_COMMUNITY): Payer: Self-pay | Admitting: *Deleted

## 2014-07-12 ENCOUNTER — Inpatient Hospital Stay (HOSPITAL_COMMUNITY): Payer: Medicaid Other

## 2014-07-12 DIAGNOSIS — N760 Acute vaginitis: Secondary | ICD-10-CM | POA: Diagnosis not present

## 2014-07-12 DIAGNOSIS — O9989 Other specified diseases and conditions complicating pregnancy, childbirth and the puerperium: Secondary | ICD-10-CM | POA: Diagnosis not present

## 2014-07-12 DIAGNOSIS — O26899 Other specified pregnancy related conditions, unspecified trimester: Secondary | ICD-10-CM

## 2014-07-12 DIAGNOSIS — O23591 Infection of other part of genital tract in pregnancy, first trimester: Secondary | ICD-10-CM

## 2014-07-12 DIAGNOSIS — B9689 Other specified bacterial agents as the cause of diseases classified elsewhere: Secondary | ICD-10-CM | POA: Insufficient documentation

## 2014-07-12 DIAGNOSIS — R109 Unspecified abdominal pain: Secondary | ICD-10-CM

## 2014-07-12 DIAGNOSIS — Z3A01 Less than 8 weeks gestation of pregnancy: Secondary | ICD-10-CM

## 2014-07-12 DIAGNOSIS — R1030 Lower abdominal pain, unspecified: Secondary | ICD-10-CM | POA: Insufficient documentation

## 2014-07-12 DIAGNOSIS — A499 Bacterial infection, unspecified: Secondary | ICD-10-CM

## 2014-07-12 LAB — CBC
HCT: 38 % (ref 36.0–46.0)
Hemoglobin: 12.9 g/dL (ref 12.0–15.0)
MCH: 29.7 pg (ref 26.0–34.0)
MCHC: 33.9 g/dL (ref 30.0–36.0)
MCV: 87.6 fL (ref 78.0–100.0)
Platelets: 292 10*3/uL (ref 150–400)
RBC: 4.34 MIL/uL (ref 3.87–5.11)
RDW: 12.5 % (ref 11.5–15.5)
WBC: 8.2 10*3/uL (ref 4.0–10.5)

## 2014-07-12 LAB — URINALYSIS, ROUTINE W REFLEX MICROSCOPIC
BILIRUBIN URINE: NEGATIVE
GLUCOSE, UA: NEGATIVE mg/dL
Hgb urine dipstick: NEGATIVE
KETONES UR: NEGATIVE mg/dL
LEUKOCYTES UA: NEGATIVE
Nitrite: NEGATIVE
PH: 8 (ref 5.0–8.0)
Protein, ur: NEGATIVE mg/dL
Specific Gravity, Urine: 1.02 (ref 1.005–1.030)
Urobilinogen, UA: 0.2 mg/dL (ref 0.0–1.0)

## 2014-07-12 LAB — WET PREP, GENITAL
Trich, Wet Prep: NONE SEEN
YEAST WET PREP: NONE SEEN

## 2014-07-12 LAB — HCG, QUANTITATIVE, PREGNANCY: HCG, BETA CHAIN, QUANT, S: 1725 m[IU]/mL — AB (ref <5)

## 2014-07-12 LAB — POCT PREGNANCY, URINE: Preg Test, Ur: POSITIVE — AB

## 2014-07-12 MED ORDER — METRONIDAZOLE 500 MG PO TABS: 500.0000 mg | ORAL_TABLET | Freq: Two times a day (BID) | ORAL | Status: DC

## 2014-07-12 NOTE — MAU Provider Note (Signed)
History     CSN: [id number]  Arrival date and time: 07/12/14 1119   None   Elizabeth [name] is a 24 year old 253P1011 female 5874w2d pregnant presenting with a 1 week history of bilateral lower abdomen pain and vaginal discharge. The pain is intermittent with no aggravating or alleviating factors. The pain is sharp and is present in either the RLQ or LLQ. Episodes of pain last a few minutes and then resolve. She reports they happen with different intensities of pain from mild to severe. The discharge is "creamish" with a "weird odor". No vaginal bleeding, dysuria or itching associated with the discharge. Denies fever, chills, headaches, dizziness, LOC, NVD or hematuria.      Chief Complaint  Patient presents with  . Abdominal Pain  . Vaginal Discharge   HPI  OB History    Gravida Para Term Preterm AB TAB SAB Ectopic Multiple Living   3 1 1  0 0 0 0 0 0 1      Past Medical History  Diagnosis Date  . No pertinent past medical history     Past Surgical History  Procedure Laterality Date  . No past surgeries      Family History  Problem Relation Age of Onset  . Anesthesia problems Neg Hx     History  Substance Use Topics  . Smoking status: Never Smoker   . Smokeless tobacco: Never Used  . Alcohol Use: No    Allergies: No Known Allergies  Prescriptions prior to admission  Medication Sig Dispense Refill Last Dose  . Prenatal Vit-Fe Fumarate-FA (PRENATAL VITAMINS PLUS) 27-1 MG TABS Take 1 tablet by mouth 1 day or 1 dose. (Patient not taking: Reported on 07/12/2014) 30 tablet 3 Not Taking at Unknown time    Review of Systems  Constitutional: Negative for fever and chills.  Gastrointestinal: Positive for abdominal pain. Negative for nausea, vomiting, diarrhea and constipation.  Genitourinary: Negative for dysuria and hematuria.  Neurological: Negative for dizziness, loss of consciousness and headaches.   Physical Exam   Blood pressure 115/76, pulse 80, temperature 98.7 F  (37.1 C), resp. rate 17, height 5' 4.5" (1.638 m), weight 52.98 kg (116 lb 12.8 oz), last menstrual period 06/05/2014, SpO2 100 %, unknown if currently breastfeeding.  Physical Exam  Constitutional: She is oriented to person, place, and time. She appears well-developed and well-nourished.  HENT:  Head: Normocephalic and atraumatic.  Eyes: EOM are normal.  Neck: Normal range of motion.  Cardiovascular: Normal rate.   Respiratory: Effort normal and breath sounds normal. No respiratory distress.  GI: Soft. She exhibits no distension. There is no tenderness. There is no rigidity, no rebound and no guarding.  Genitourinary: There is no rash, tenderness or lesion on the right labia. There is no rash, tenderness or lesion on the left labia. Uterus is not tender. Cervix exhibits no motion tenderness and no friability. Right adnexum displays no tenderness. Left adnexum displays no tenderness. No bleeding in the vagina. Vaginal discharge (thick, white) found.  Musculoskeletal: Normal range of motion.  Neurological: She is alert and oriented to person, place, and time.  Skin: Skin is warm and dry.  Psychiatric: She has a normal mood and affect.   [place] Ob Comp Less 14 Wks  07/12/2014   CLINICAL DATA:  Acute onset of pelvic pain for 1 week. Vaginal discharge. Initial encounter.  EXAM: OBSTETRIC <14 WK US AND TRANSVAGINAL OB US  TECHNIQUE: Both transabdominal and transvaginal ultrasound examinations were performed for complete evaluation of  the gestation as well as the maternal uterus, adnexal regions, and pelvic cul-de-sac. Transvaginal technique was performed to assess early pregnancy.  COMPARISON:  Pelvic ultrasound performed 01/28/2014  FINDINGS: Intrauterine gestational sac: Visualized/normal in shape.  Yolk sac:  No  Embryo:  No  Cardiac Activity: N/A  MSD: 4.9 mm   5 w   0  d  Maternal uterus/adnexae: No subchorionic hemorrhage is noted. The uterus is otherwise unremarkable.  The right ovary is unremarkable  in appearance, measuring 3.4 x 2.0 x 2.3 cm. The left ovary is not visualized on this study. No suspicious adnexal masses are seen.  Trace free fluid is seen within the pelvic cul-de-sac.  IMPRESSION: Single intrauterine gestational sac noted, with a mean sac diameter of 5 mm, corresponding to a gestational age of [redacted] weeks 0 days. This matches the gestational age of [redacted] weeks 2 days by LMP, reflecting an estimated date of delivery of October 2nd, 2016. The yolk sac and embryo are not yet seen, within normal limits given the size of the gestational sac.   Electronically Signed   By: Roanna Raider M.D.   On: 07/12/2014 17:36   US Ob Transvaginal  07/12/2014   CLINICAL DATA:  Acute onset of pelvic pain for 1 week. Vaginal discharge. Initial encounter.  EXAM: OBSTETRIC <14 WK [place] AND TRANSVAGINAL OB US  TECHNIQUE: Both transabdominal and transvaginal ultrasound examinations were performed for complete evaluation of the gestation as well as the maternal uterus, adnexal regions, and pelvic cul-de-sac. Transvaginal technique was performed to assess early pregnancy.  COMPARISON:  Pelvic ultrasound performed 01/28/2014  FINDINGS: Intrauterine gestational sac: Visualized/normal in shape.  Yolk sac:  No  Embryo:  No  Cardiac Activity: N/A  MSD: 4.9 mm   5 w   0  d  Maternal uterus/adnexae: No subchorionic hemorrhage is noted. The uterus is otherwise unremarkable.  The right ovary is unremarkable in appearance, measuring 3.4 x 2.0 x 2.3 cm. The left ovary is not visualized on this study. No suspicious adnexal masses are seen.  Trace free fluid is seen within the pelvic cul-de-sac.  IMPRESSION: Single intrauterine gestational sac noted, with a mean sac diameter of 5 mm, corresponding to a gestational age of [redacted] weeks 0 days. This matches the gestational age of [redacted] weeks 2 days by LMP, reflecting an estimated date of delivery of October 2nd, 2016. The yolk sac and embryo are not yet seen, within normal limits given the size of the  gestational sac.   Electronically Signed   By: Roanna Raider M.D.   On: 07/12/2014 17:36    MAU Course  Procedures  MDM Wet prep/exam consistent with BV U/S with no yolk sac, no fetus seen  Assessment and Plan  A: Bacterial vaginosis in pregnancy Abdominal pain in pregnancy  P: Discharge to home Flagyl x 1 week No etoh/IC x 10 days Ectopic precautions  RTC 48 hours for f/u quant HCG Patient may return to MAU as needed or if her condition were to change or worsen   Barrett,Stevi M 07/12/2014, 4:14 PM  I have seen and evaluated the patient with the PA student. I agree with the assessment and plan as written above.   Bertram Denver, PA-C  07/12/2014 6:23 PM

## 2014-07-12 NOTE — MAU Note (Signed)
Been having bilateral abd pain, started about a wk ago.  Also has a d/c with a small odor.

## 2014-07-12 NOTE — Discharge Instructions (Signed)

## 2014-07-13 LAB — HIV ANTIBODY (ROUTINE TESTING W REFLEX): HIV Screen 4th Generation wRfx: NONREACTIVE

## 2014-07-14 ENCOUNTER — Inpatient Hospital Stay (HOSPITAL_COMMUNITY)
Admission: AD | Admit: 2014-07-14 | Discharge: 2014-07-14 | Disposition: A | Discharge status: Home / Self Care | Payer: Medicaid Other | Source: Ambulatory Visit | Attending: Family Medicine | Admitting: Family Medicine

## 2014-07-14 DIAGNOSIS — R109 Unspecified abdominal pain: Secondary | ICD-10-CM

## 2014-07-14 DIAGNOSIS — O9989 Other specified diseases and conditions complicating pregnancy, childbirth and the puerperium: Secondary | ICD-10-CM | POA: Insufficient documentation

## 2014-07-14 DIAGNOSIS — O0281 Inappropriate change in quantitative human chorionic gonadotropin (hCG) in early pregnancy: Secondary | ICD-10-CM | POA: Diagnosis not present

## 2014-07-14 DIAGNOSIS — O26899 Other specified pregnancy related conditions, unspecified trimester: Secondary | ICD-10-CM

## 2014-07-14 DIAGNOSIS — Z3A01 Less than 8 weeks gestation of pregnancy: Secondary | ICD-10-CM | POA: Diagnosis not present

## 2014-07-14 LAB — GC/CHLAMYDIA PROBE AMP (~~LOC~~) NOT AT ARMC
Chlamydia: NEGATIVE
Neisseria Gonorrhea: NEGATIVE

## 2014-07-14 LAB — HCG, QUANTITATIVE, PREGNANCY: HCG, BETA CHAIN, QUANT, S: 1602 m[IU]/mL — AB (ref <5)

## 2014-07-14 NOTE — MAU Provider Note (Signed)
  History     CSN: [id number]  Arrival date and time: 07/14/14 1557   None     Chief Complaint  Patient presents with  . Follow-up   HPI  Pt is a 24 yo G3P1011 here for follow-up BHCG.  Pt seen on 07/12/14 for bilateral lower abdominal pain and vaginal discharge.  BHCG 1725 and ultrasound showed:    IMPRESSION: Single intrauterine gestational sac noted, with a mean sac diameter of 5 mm, corresponding to a gestational age of [redacted] weeks 0 days. This matches the gestational age of [redacted] weeks 2 days by LMP, reflecting an estimated date of delivery of October 2nd, 2016. The yolk sac and embryo are not yet seen, within normal limits given the size of the gestational sac.  Sent home with treatment for bacterial vaginosis and instructed to return in two days.  Here today with no report of vaginal bleeding or abdominal pain.    Past Medical History  Diagnosis Date  . No pertinent past medical history     Past Surgical History  Procedure Laterality Date  . No past surgeries      Family History  Problem Relation Age of Onset  . Anesthesia problems Neg Hx     History  Substance Use Topics  . Smoking status: Never Smoker   . Smokeless tobacco: Never Used  . Alcohol Use: No    Allergies: No Known Allergies  Prescriptions prior to admission  Medication Sig Dispense Refill Last Dose  . metroNIDAZOLE (FLAGYL) 500 MG tablet Take 1 tablet (500 mg total) by mouth 2 (two) times daily. 14 tablet 0   . Prenatal Vit-Fe Fumarate-FA (PRENATAL VITAMINS PLUS) 27-1 MG TABS Take 1 tablet by mouth 1 day or 1 dose. (Patient not taking: Reported on 07/12/2014) 30 tablet 3 Not Taking at Unknown time    ROS   Pertinent info in HPI  Physical Exam   Blood pressure 114/69, pulse 75, temperature 98.8 F (37.1 C), temperature source Oral, resp. rate 18, last menstrual period 06/05/2014, unknown if currently breastfeeding.  Physical Exam  Constitutional: She is oriented to person, place, and time. She  appears well-developed and well-nourished. No distress.  HENT:  Head: Normocephalic.  Neck: Normal range of motion. Neck supple.  Neurological: She is alert and oriented to person, place, and time. She has normal reflexes.  Skin: Skin is warm and dry.    MAU Course  Procedures Results for orders placed or performed during the hospital encounter of 07/14/14 (from the past 24 hour(s))  hCG, quantitative, pregnancy     Status: Abnormal   Collection Time: 07/14/14  4:19 PM  Result Value Ref Range   hCG, Beta Chain, Quant, S 1602 (H) <5 mIU/mL     Assessment and Plan   23 yo G3P1011 at 3259w4d wks Decreasing BHCG  Plan: Repeat ultrasound in one week Explained to patient likely a failed pregnancy Reviewed bleeding precautions  Elenora FenderKARIM, [facility] N

## 2014-07-14 NOTE — MAU Note (Signed)
Pt here for f/u BHCG. Pt denies vag bleeding or pain at this time.

## 2014-07-21 ENCOUNTER — Encounter (HOSPITAL_COMMUNITY): Payer: Self-pay | Admitting: *Deleted

## 2014-07-21 ENCOUNTER — Ambulatory Visit (HOSPITAL_COMMUNITY)
Admission: RE | Admit: 2014-07-21 | Discharge: 2014-07-21 | Disposition: A | Discharge status: Home / Self Care | Payer: Medicaid Other | Source: Ambulatory Visit | Attending: Family | Admitting: Family

## 2014-07-21 ENCOUNTER — Inpatient Hospital Stay (HOSPITAL_COMMUNITY)
Admission: AD | Admit: 2014-07-21 | Discharge: 2014-07-21 | Disposition: A | Discharge status: Home / Self Care | Payer: Medicaid Other | Source: Ambulatory Visit | Attending: Obstetrics & Gynecology | Admitting: Obstetrics & Gynecology

## 2014-07-21 DIAGNOSIS — O9989 Other specified diseases and conditions complicating pregnancy, childbirth and the puerperium: Secondary | ICD-10-CM | POA: Diagnosis present

## 2014-07-21 DIAGNOSIS — Z87891 Personal history of nicotine dependence: Secondary | ICD-10-CM | POA: Insufficient documentation

## 2014-07-21 DIAGNOSIS — R109 Unspecified abdominal pain: Secondary | ICD-10-CM | POA: Insufficient documentation

## 2014-07-21 DIAGNOSIS — O3680X0 Pregnancy with inconclusive fetal viability, not applicable or unspecified: Secondary | ICD-10-CM

## 2014-07-21 DIAGNOSIS — O0281 Inappropriate change in quantitative human chorionic gonadotropin (hCG) in early pregnancy: Secondary | ICD-10-CM | POA: Diagnosis not present

## 2014-07-21 DIAGNOSIS — Z3A01 Less than 8 weeks gestation of pregnancy: Secondary | ICD-10-CM | POA: Diagnosis not present

## 2014-07-21 DIAGNOSIS — O26899 Other specified pregnancy related conditions, unspecified trimester: Secondary | ICD-10-CM

## 2014-07-21 LAB — CBC
HCT: 38.5 % (ref 36.0–46.0)
Hemoglobin: 12.7 g/dL (ref 12.0–15.0)
MCH: 29.1 pg (ref 26.0–34.0)
MCHC: 33 g/dL (ref 30.0–36.0)
MCV: 88.1 fL (ref 78.0–100.0)
PLATELETS: 289 10*3/uL (ref 150–400)
RBC: 4.37 MIL/uL (ref 3.87–5.11)
RDW: 13 % (ref 11.5–15.5)
WBC: 6.8 10*3/uL (ref 4.0–10.5)

## 2014-07-21 LAB — COMPREHENSIVE METABOLIC PANEL
ALT: 25 U/L (ref 0–35)
ANION GAP: 4 — AB (ref 5–15)
AST: 21 U/L (ref 0–37)
Albumin: 4.3 g/dL (ref 3.5–5.2)
Alkaline Phosphatase: 34 U/L — ABNORMAL LOW (ref 39–117)
BUN: 7 mg/dL (ref 6–23)
CO2: 25 mmol/L (ref 19–32)
CREATININE: 0.58 mg/dL (ref 0.50–1.10)
Calcium: 8.8 mg/dL (ref 8.4–10.5)
Chloride: 105 mmol/L (ref 96–112)
GFR calc non Af Amer: >90 mL/min (ref >90)
Glucose, Bld: 111 mg/dL — ABNORMAL HIGH (ref 70–99)
Potassium: 3.6 mmol/L (ref 3.5–5.1)
Sodium: 134 mmol/L — ABNORMAL LOW (ref 135–145)
TOTAL PROTEIN: 7.5 g/dL (ref 6.0–8.3)
Total Bilirubin: 1.1 mg/dL (ref 0.3–1.2)

## 2014-07-21 LAB — HCG, QUANTITATIVE, PREGNANCY: hCG, Beta Chain, Quant, S: 1966 m[IU]/mL — ABNORMAL HIGH (ref <5)

## 2014-07-21 MED ORDER — METHOTREXATE INJECTION FOR WOMEN'S HOSPITAL
50.0000 mg/m2 | Freq: Once | INTRAMUSCULAR | Status: AC
  Administered 2014-07-21: 80 mg via INTRAMUSCULAR
  Filled 2014-07-21: qty 1.6

## 2014-07-21 NOTE — MAU Note (Signed)
Pt given methotrexate, observed for protocol length of time.  Pt told to follow-up on Sunday 07/24/14.

## 2014-07-21 NOTE — Discharge Instructions (Signed)
°Ectopic Pregnancy °An ectopic pregnancy is when the fertilized egg attaches (implants) outside the uterus. Most ectopic pregnancies occur in the fallopian tube. Rarely do ectopic pregnancies occur on the ovary, intestine, pelvis, or cervix. In an ectopic pregnancy, the fertilized egg does not have the ability to develop into a normal, healthy baby.  °A ruptured ectopic pregnancy is one in which the fallopian tube gets torn or bursts and results in internal bleeding. Often there is intense abdominal pain, and sometimes, vaginal bleeding. Having an ectopic pregnancy can be life threatening. If left untreated, this dangerous condition can lead to a blood transfusion, abdominal surgery, or even death. °CAUSES  °Damage to the fallopian tubes is the suspected cause in most ectopic pregnancies.  °RISK FACTORS °Depending on your circumstances, the risk of having an ectopic pregnancy will vary. The level of risk can be divided into three categories. °High Risk °· You have gone through infertility treatment. °· You have had a previous ectopic pregnancy. °· You have had previous tubal surgery. °· You have had previous surgery to have the fallopian tubes tied (tubal ligation). °· You have tubal problems or diseases. °· You have been exposed to DES. DES is a medicine that was used until 1971 and had effects on babies whose mothers took the medicine. °· You become pregnant while using an intrauterine device (IUD) for birth control.  °Moderate Risk °· You have a history of infertility. °· You have a history of a sexually transmitted infection (STI). °· You have a history of pelvic inflammatory disease (PID). °· You have scarring from endometriosis. °· You have multiple sexual partners. °· You smoke.  °Low Risk °· You have had previous pelvic surgery. °· You use vaginal douching. °· You became sexually active before 22 years of age. °SIGNS AND SYMPTOMS  °An ectopic pregnancy should be suspected in anyone who has missed a period  and has abdominal pain or bleeding. °· You may experience normal pregnancy symptoms, such as: °¨ Nausea. °¨ Tiredness. °¨ Breast tenderness. °· Other symptoms may include: °¨ Pain with intercourse. °¨ Irregular vaginal bleeding or spotting. °¨ Cramping or pain on one side or in the lower abdomen. °¨ Fast heartbeat. °¨ Passing out while having a bowel movement. °· Symptoms of a ruptured ectopic pregnancy and internal bleeding may include: °¨ Sudden, severe pain in the abdomen and pelvis. °¨ Dizziness or fainting. °¨ Pain in the shoulder area. °DIAGNOSIS  °Tests that may be performed include: °· A pregnancy test. °· An ultrasound test. °· Testing the specific level of pregnancy hormone in the bloodstream. °· Taking a sample of uterus tissue (dilation and curettage, D&C). °· Surgery to perform a visual exam of the inside of the abdomen using a thin, lighted tube with a tiny camera on the end (laparoscope). °TREATMENT  °An injection of a medicine called methotrexate may be given. This medicine causes the pregnancy tissue to be absorbed. It is given if: °· The diagnosis is made early. °· The fallopian tube has not ruptured. °· You are considered to be a good candidate for the medicine. °Usually, pregnancy hormone blood levels are checked after methotrexate treatment. This is to be sure the medicine is effective. It may take 4-6 weeks for the pregnancy to be absorbed (though most pregnancies will be absorbed by 3 weeks). °Surgical treatment may be needed. A laparoscope may be used to remove the pregnancy tissue. If severe internal bleeding occurs, a cut (incision) may be made in the lower abdomen (laparotomy), and the ectopic   pregnancy is removed. This stops the bleeding. Part of the fallopian tube, or the whole tube, may be removed as well (salpingectomy). After surgery, pregnancy hormone tests may be done to be sure there is no pregnancy tissue left. You may receive a Rho (D) immune globulin shot if you are Rh negative  and the father is Rh positive, or if you do not know the Rh type of the father. This is to prevent problems with any future pregnancy. °SEEK IMMEDIATE MEDICAL CARE IF:  °You have any symptoms of an ectopic pregnancy. This is a medical emergency. °MAKE SURE YOU: °· Understand these instructions. °· Will watch your condition. °· Will get help right away if you are not doing well or get worse. °Document Released: 07/04/2004 Document Revised: 10/11/2013 Document Reviewed: 12/24/2012 °Exit[employer]® Patient Information ©2015 [employer]. This information is not intended to replace advice given to you by your health care provider. Make sure you discuss any questions you have with your health care provider. ° ° °

## 2014-07-21 NOTE — MAU Note (Signed)
Pt here for f/u u/s needs results. Denies pain or bleeding.

## 2014-07-21 NOTE — MAU Provider Note (Signed)
History     CSN: [id number]  Arrival date and time: 07/21/14 1702   First Provider Initiated Contact with Patient 07/21/14 1730      Chief Complaint  Patient presents with  . Follow-up   HPI  Elizabeth [name] is a 24 y.o. G3P1011 at [redacted]w[redacted]d who presents today for FU [place]. She denies any abdominal pain or vaginal bleeding at this time. She states that she has had some cramping off and on, but has not had any cramping today.   Past Medical History  Diagnosis Date  . No pertinent past medical history     Past Surgical History  Procedure Laterality Date  . No past surgeries      Family History  Problem Relation Age of Onset  . Anesthesia problems Neg Hx     History  Substance Use Topics  . Smoking status: Former Smoker    Quit date: 06/02/2014  . Smokeless tobacco: Never Used  . Alcohol Use: No    Allergies: No Known Allergies  Prescriptions prior to admission  Medication Sig Dispense Refill Last Dose  . metroNIDAZOLE (FLAGYL) 500 MG tablet Take 1 tablet (500 mg total) by mouth 2 (two) times daily. 14 tablet 0   . Prenatal Vit-Fe Fumarate-FA (PRENATAL VITAMINS PLUS) 27-1 MG TABS Take 1 tablet by mouth 1 day or 1 dose. (Patient not taking: Reported on 07/12/2014) 30 tablet 3 Not Taking at Unknown time    ROS Physical Exam   Last menstrual period 06/05/2014, unknown if currently breastfeeding.  Physical Exam  Nursing note and vitals reviewed. Constitutional: She is oriented to person, place, and time. She appears well-developed and well-nourished. No distress.  Cardiovascular: Normal rate.   Respiratory: Effort normal.  Neurological: She is alert and oriented to person, place, and time.  Skin: Skin is warm and dry.  Psychiatric: She has a normal mood and affect.    MAU Course  Procedures  US Ob Transvaginal  07/21/2014   CLINICAL DATA:  Abdominal pain in pregnancy. Following quantitative beta HCG levels. Unsure of LMP.  EXAM: TRANSVAGINAL OB ULTRASOUND  TECHNIQUE:  Transvaginal ultrasound was performed for complete evaluation of the gestation as well as the maternal uterus, adnexal regions, and pelvic cul-de-sac.  COMPARISON:  07/12/2014  FINDINGS: Intrauterine gestational sac: Visualized/normal in shape.  Yolk sac:  Not visualized  Embryo:  Not visualized  MSD: 6  mm   5 w   1  d  Maternal uterus/adnexae: Both ovaries are normal in appearance. No mass or free fluid identified.  IMPRESSION: Single 5 week intrauterine gestational sac shows lack of expected progression since prior exam. Findings are suspicious but not yet definitive for failed pregnancy. Recommend follow-up US in 7 days for definitive diagnosis. This recommendation follows SRU consensus guidelines: Diagnostic Criteria for Nonviable Pregnancy Early in the First Trimester. Malva Limes Med 2013; 272:5366-44.   Electronically Signed   By: Myles Rosenthal M.D.   On: 07/21/2014 16:42   D/W the patient the findings of the [place]. Plan to repeat HCG/CBC and CMET today.  Results for [name], [name] (MRN [id number]) as of 07/21/2014 19:02  Ref. Range 07/12/2014 16:45 07/12/2014 17:31 07/14/2014 16:19 07/21/2014 16:18 07/21/2014 17:50  hCG, Beta Chain, Quant, S Latest Range: <5 mIU/mL 1725 (H)  1602 (H)  1966 (H)    Results for orders placed or performed during the hospital encounter of 07/21/14 (from the past 24 hour(s))  hCG, quantitative, pregnancy     Status: Abnormal   Collection Time:  07/21/14  5:50 PM  Result Value Ref Range   hCG, Beta Chain, Quant, S 1966 (H) <5 mIU/mL  CBC     Status: None   Collection Time: 07/21/14  5:50 PM  Result Value Ref Range   WBC 6.8 4.0 - 10.5 K/uL   RBC 4.37 3.87 - 5.11 MIL/uL   Hemoglobin 12.7 12.0 - 15.0 g/dL   HCT 16.138.5 09.636.0 - 04.546.0 %   MCV 88.1 78.0 - 100.0 fL   MCH 29.1 26.0 - 34.0 pg   MCHC 33.0 30.0 - 36.0 g/dL   RDW 40.913.0 81.111.5 - 91.415.5 %   Platelets 289 150 - 400 K/uL  Comprehensive metabolic panel     Status: Abnormal   Collection Time: 07/21/14  5:50 PM  Result Value Ref Range    Sodium 134 (L) 135 - 145 mmol/L   Potassium 3.6 3.5 - 5.1 mmol/L   Chloride 105 96 - 112 mmol/L   CO2 25 19 - 32 mmol/L   Glucose, Bld 111 (H) 70 - 99 mg/dL   BUN 7 6 - 23 mg/dL   Creatinine, Ser 7.820.58 0.50 - 1.10 mg/dL   Calcium 8.8 8.4 - 95.610.5 mg/dL   Total Protein 7.5 6.0 - 8.3 g/dL   Albumin 4.3 3.5 - 5.2 g/dL   AST 21 0 - 37 U/L   ALT 25 0 - 35 U/L   Alkaline Phosphatase 34 (L) 39 - 117 U/L   Total Bilirubin 1.1 0.3 - 1.2 mg/dL   GFR calc non Af Amer >90 >90 mL/min   GFR calc Af Amer >90 >90 mL/min   Anion gap 4 (L) 5 - 15    1904: D/W Dr. Macon LargeAnyanwu: offer the patient MTX today.  1915: D/W the patient at length R/B/A of MTX reviewed with the patient. She agrees to proceeding with MTX at this time.  Assessment and Plan   1. Pregnancy of unknown anatomic location   2. 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   Why:  Sunday 07/24/14 for repeat bloodwork    Contact information:   [address] [id number] mc [place] [id number] [phone]       Tawnya CrookHogan, Heather Donovan 07/21/2014, 5:33 PM

## 2014-08-02 ENCOUNTER — Telehealth: Payer: Self-pay | Admitting: *Deleted

## 2014-08-02 NOTE — Telephone Encounter (Signed)
Called pt regarding need for clinic appt. I left a message requesting her to call back to the nurse voice mail and acknowledge receiving this message. We will call her back to explain further. **Pt is lost to care following failed pregnancy. She was given MTX in MAU on 2/11 with instructions to return on 2/14 for repeat BHCG. She did not return for the needed lab test. [facility] and then will need follow up appt with provider based on results of lab.

## 2014-08-03 NOTE — Telephone Encounter (Signed)
Called patient, no answer- left message stating we are trying to reach you with important information. Please call us back at the clinics. Called emergency contact and patient was not there, asked her aunt to have patient call us if she speaks with her. She said she would.

## 2014-08-04 ENCOUNTER — Encounter: Payer: Self-pay | Admitting: Obstetrics and Gynecology

## 2014-08-04 ENCOUNTER — Other Ambulatory Visit: Payer: Medicaid Other

## 2014-08-04 DIAGNOSIS — O3680X0 Pregnancy with inconclusive fetal viability, not applicable or unspecified: Secondary | ICD-10-CM

## 2014-08-04 NOTE — Telephone Encounter (Signed)
Patient came in today for lab

## 2014-08-05 LAB — HCG, QUANTITATIVE, PREGNANCY: hCG, Beta Chain, Quant, S: 6.8 m[IU]/mL

## 2014-08-11 ENCOUNTER — Encounter: Payer: Self-pay | Admitting: Family Medicine

## 2014-08-11 ENCOUNTER — Other Ambulatory Visit: Payer: Medicaid Other

## 2014-08-11 DIAGNOSIS — O209 Hemorrhage in early pregnancy, unspecified: Secondary | ICD-10-CM

## 2014-08-12 LAB — HCG, QUANTITATIVE, PREGNANCY: hCG, Beta Chain, Quant, S: <2 m[IU]/mL

## 2014-08-15 ENCOUNTER — Other Ambulatory Visit: Payer: Self-pay | Admitting: Physician Assistant

## 2014-08-29 ENCOUNTER — Ambulatory Visit (INDEPENDENT_AMBULATORY_CARE_PROVIDER_SITE_OTHER): Payer: Medicaid Other

## 2014-08-29 ENCOUNTER — Encounter: Payer: Self-pay | Admitting: Obstetrics & Gynecology

## 2014-08-29 DIAGNOSIS — Z111 Encounter for screening for respiratory tuberculosis: Secondary | ICD-10-CM

## 2014-08-29 MED ORDER — TUBERCULIN PPD 5 UNIT/0.1ML ID SOLN
5.0000 [IU] | Freq: Once | INTRADERMAL | Status: AC
  Administered 2014-08-29: 5 [IU] via INTRADERMAL

## 2014-08-29 NOTE — Progress Notes (Signed)
Patient here today for PPD test for work. Injection into right forearm. Patient to return on 3/23 for reading.

## 2014-08-31 ENCOUNTER — Encounter: Payer: Self-pay | Admitting: General Practice

## 2014-08-31 ENCOUNTER — Ambulatory Visit: Payer: Medicaid Other | Admitting: General Practice

## 2014-08-31 DIAGNOSIS — Z111 Encounter for screening for respiratory tuberculosis: Secondary | ICD-10-CM

## 2014-08-31 NOTE — Progress Notes (Addendum)
Patient here to have TB skin test read of right forearm. No reaction, redness or swelling noted. Negative TB skin test. Will give  TB skin test letter

## 2014-10-12 ENCOUNTER — Telehealth: Payer: Self-pay

## 2014-10-12 DIAGNOSIS — B3731 Acute candidiasis of vulva and vagina: Secondary | ICD-10-CM

## 2014-10-12 DIAGNOSIS — B373 Candidiasis of vulva and vagina: Secondary | ICD-10-CM

## 2014-10-12 MED ORDER — FLUCONAZOLE 150 MG PO TABS: 150.0000 mg | ORAL_TABLET | Freq: Once | ORAL | Status: DC

## 2014-10-12 NOTE — Telephone Encounter (Signed)
Patient called stating she has discharge and wants to know if she needs to make an appointment. Called patient who reports white thick discharge with some vaginal itching-- noted it have douching last week. Informed patient RX for Diflucan will be sent to pharmacy and advised she contact clinic if symptoms do not resolve after atleast 72 hours. Patient verbalized understanding and gratitude. No further questions or concerns.

## 2014-11-23 ENCOUNTER — Ambulatory Visit: Payer: Medicaid Other | Admitting: Obstetrics & Gynecology

## 2014-12-02 ENCOUNTER — Ambulatory Visit: Payer: Medicaid Other | Admitting: Family Medicine

## 2014-12-21 ENCOUNTER — Ambulatory Visit: Payer: Medicaid Other | Admitting: Family Medicine

## 2015-03-24 ENCOUNTER — Encounter (HOSPITAL_COMMUNITY): Payer: Self-pay | Admitting: *Deleted

## 2015-03-24 ENCOUNTER — Inpatient Hospital Stay (HOSPITAL_COMMUNITY): Payer: Medicaid Other

## 2015-03-24 ENCOUNTER — Inpatient Hospital Stay (HOSPITAL_COMMUNITY)
Admission: AD | Admit: 2015-03-24 | Discharge: 2015-03-24 | Disposition: A | Discharge status: Home / Self Care | Payer: Medicaid Other | Source: Ambulatory Visit | Attending: Obstetrics and Gynecology | Admitting: Obstetrics and Gynecology

## 2015-03-24 DIAGNOSIS — O26891 Other specified pregnancy related conditions, first trimester: Secondary | ICD-10-CM | POA: Diagnosis not present

## 2015-03-24 DIAGNOSIS — Z87891 Personal history of nicotine dependence: Secondary | ICD-10-CM | POA: Diagnosis not present

## 2015-03-24 DIAGNOSIS — O9989 Other specified diseases and conditions complicating pregnancy, childbirth and the puerperium: Secondary | ICD-10-CM | POA: Diagnosis not present

## 2015-03-24 DIAGNOSIS — O26899 Other specified pregnancy related conditions, unspecified trimester: Secondary | ICD-10-CM

## 2015-03-24 DIAGNOSIS — Z3A00 Weeks of gestation of pregnancy not specified: Secondary | ICD-10-CM | POA: Insufficient documentation

## 2015-03-24 DIAGNOSIS — R109 Unspecified abdominal pain: Secondary | ICD-10-CM | POA: Diagnosis not present

## 2015-03-24 DIAGNOSIS — O3680X0 Pregnancy with inconclusive fetal viability, not applicable or unspecified: Secondary | ICD-10-CM

## 2015-03-24 LAB — CBC
HCT: 36.2 % (ref 36.0–46.0)
HEMOGLOBIN: 12.2 g/dL (ref 12.0–15.0)
MCH: 29.3 pg (ref 26.0–34.0)
MCHC: 33.7 g/dL (ref 30.0–36.0)
MCV: 86.8 fL (ref 78.0–100.0)
Platelets: 272 10*3/uL (ref 150–400)
RBC: 4.17 MIL/uL (ref 3.87–5.11)
RDW: 12.9 % (ref 11.5–15.5)
WBC: 9 10*3/uL (ref 4.0–10.5)

## 2015-03-24 LAB — WET PREP, GENITAL
CLUE CELLS WET PREP: NONE SEEN
TRICH WET PREP: NONE SEEN
Yeast Wet Prep HPF POC: NONE SEEN

## 2015-03-24 LAB — URINALYSIS, ROUTINE W REFLEX MICROSCOPIC
Bilirubin Urine: NEGATIVE
GLUCOSE, UA: NEGATIVE mg/dL
HGB URINE DIPSTICK: NEGATIVE
Ketones, ur: NEGATIVE mg/dL
Leukocytes, UA: NEGATIVE
Nitrite: NEGATIVE
Protein, ur: NEGATIVE mg/dL
Specific Gravity, Urine: 1.02 (ref 1.005–1.030)
Urobilinogen, UA: 0.2 mg/dL (ref 0.0–1.0)
pH: 8.5 — ABNORMAL HIGH (ref 5.0–8.0)

## 2015-03-24 LAB — ABO/RH: ABO/RH(D): B POS

## 2015-03-24 LAB — HCG, QUANTITATIVE, PREGNANCY: HCG, BETA CHAIN, QUANT, S: 383 m[IU]/mL — AB (ref <5)

## 2015-03-24 LAB — POCT PREGNANCY, URINE: Preg Test, Ur: POSITIVE — AB

## 2015-03-24 NOTE — MAU Provider Note (Signed)
History     CSN: [id number]  Arrival date and time: 03/24/15 1717   First Provider Initiated Contact with Patient 03/24/15 1813         Chief Complaint  Patient presents with  . Abdominal Pain   HPI  Elizabeth [name] is a 24 y.o. female who presents for abdominal pain & positive pregnancy test.  Lower abdominal pain started over a week ago. Comes & goes, cramplike. Rates as 4/10. No meds.  Denies vaginal bleeding or discharge.  Reports nausea, no vomiting.   No diarrhea or constipation Weird sensation near urethra after urination; for a few days. No other urinary complaints.   OB History    Gravida Para Term Preterm AB TAB SAB Ectopic Multiple Living   0 2 1 0 1 0 1      Past Medical History  Diagnosis Date  . No pertinent past medical history     hx ectopic pregnancy    Past Surgical History  Procedure Laterality Date  . Therapeutic abortion      Family History  Problem Relation Age of Onset  . Anesthesia problems Neg Hx     Social History  Substance Use Topics  . Smoking status: Former Smoker    Types: Cigarettes    Quit date: 06/02/2014  . Smokeless tobacco: Never Used  . Alcohol Use: Yes     Comment: socially    Allergies: No Known Allergies  Prescriptions prior to admission  Medication Sig Dispense Refill Last Dose  . fluconazole (DIFLUCAN) 150 MG tablet Take 1 tablet (150 mg total) by mouth once. 1 tablet 0   . metroNIDAZOLE (FLAGYL) 500 MG tablet Take 1 tablet (500 mg total) by mouth 2 (two) times daily. 14 tablet 0 07/21/2014 at Unknown time  . Prenatal Vit-Fe Fumarate-FA (PRENATAL VITAMINS PLUS) 27-1 MG TABS Take 1 tablet by mouth 1 day or 1 dose. 30 tablet 3 07/21/2014 at Unknown time    ROS Physical Exam   Blood pressure 130/65, pulse 106, temperature 98 F (36.7 C), temperature source Oral, resp. rate 16, height  (1.626 m), weight 127 lb 6.4 oz (57.788 kg), last menstrual period 02/18/2015, unknown if currently  breastfeeding.  Physical Exam  Nursing note and vitals reviewed. Constitutional: She is oriented to person, place, and time. She appears well-developed and well-nourished. No distress.  HENT:  Head: Normocephalic and atraumatic.  Eyes: Conjunctivae are normal. Right eye exhibits no discharge. Left eye exhibits no discharge. No scleral icterus.  Neck: Normal range of motion.  Cardiovascular: Normal rate, regular rhythm and normal heart sounds.   No murmur heard. Respiratory: Effort normal and breath sounds normal. No respiratory distress. She has no wheezes.  GI: Soft. Bowel sounds are normal. She exhibits no distension. There is no tenderness.  Genitourinary: Vagina normal and uterus normal. Cervix exhibits no motion tenderness, no discharge and no friability.  Neurological: She is alert and oriented to person, place, and time.  Skin: Skin is warm and dry. She is not diaphoretic.  Psychiatric: She has a normal mood and affect. Her behavior is normal. Judgment and thought content normal.    MAU Course  Procedures Results for orders placed or performed during the hospital encounter of 03/24/15 (from the past 24 hour(s))  Urinalysis, Routine w reflex microscopic (not at [facility])     Status: Abnormal   Collection Time: 03/24/15  5:40 PM  Result Value Ref Range   Color, Urine YELLOW YELLOW   APPearance CLOUDY (  A) CLEAR   Specific Gravity, Urine 1.020 1.005 - 1.030   pH 8.5 (H) 5.0 - 8.0   Glucose, UA NEGATIVE NEGATIVE mg/dL   Hgb urine dipstick NEGATIVE NEGATIVE   Bilirubin Urine NEGATIVE NEGATIVE   Ketones, ur NEGATIVE NEGATIVE mg/dL   Protein, ur NEGATIVE NEGATIVE mg/dL   Urobilinogen, UA 0.2 0.0 - 1.0 mg/dL   Nitrite NEGATIVE NEGATIVE   Leukocytes, UA NEGATIVE NEGATIVE  Pregnancy, urine POC     Status: Abnormal   Collection Time: 03/24/15  5:51 PM  Result Value Ref Range   Preg Test, Ur POSITIVE (A) NEGATIVE  CBC     Status: None   Collection Time: 03/24/15  6:12 PM  Result  Value Ref Range   WBC 9.0 4.0 - 10.5 K/uL   RBC 4.17 3.87 - 5.11 MIL/uL   Hemoglobin 12.2 12.0 - 15.0 g/dL   HCT 21.3 08.6 - 57.8 %   MCV 86.8 78.0 - 100.0 fL   MCH 29.3 26.0 - 34.0 pg   MCHC 33.7 30.0 - 36.0 g/dL   RDW 46.9 62.9 - 52.8 %   Platelets 272 150 - 400 K/uL  ABO/Rh     Status: None   Collection Time: 03/24/15  6:12 PM  Result Value Ref Range   ABO/RH(D) B POS   hCG, quantitative, pregnancy     Status: Abnormal   Collection Time: 03/24/15  6:12 PM  Result Value Ref Range   hCG, Beta Chain, Quant, S 383 (H) <5 mIU/mL  Wet prep, genital     Status: Abnormal   Collection Time: 03/24/15  6:25 PM  Result Value Ref Range   Yeast Wet Prep HPF POC NONE SEEN NONE SEEN   Trich, Wet Prep NONE SEEN NONE SEEN   Clue Cells Wet Prep HPF POC NONE SEEN NONE SEEN   WBC, Wet Prep HPF POC FEW (A) NONE SEEN   US Ob Comp Less 14 Wks  03/24/2015  CLINICAL DATA:  Abdominal pain, pregnant EXAM: OBSTETRIC <14 WK [place] AND TRANSVAGINAL OB US TECHNIQUE: Both transabdominal and transvaginal ultrasound examinations were performed for complete evaluation of the gestation as well as the maternal uterus, adnexal regions, and pelvic cul-de-sac. Transvaginal technique was performed to assess early pregnancy. COMPARISON:  None. FINDINGS: Intrauterine gestational sac: Not present. Yolk sac:  Not present. Embryo:  Not present. Cardiac Activity: Not present. Maternal uterus/adnexae: No adnexal mass. Bilateral ovaries are normal in size and echogenicity. Small hypoechoic right ovarian mass likely representing a corpus luteum cyst of pregnancy. Right ovary measures 4.5 x 2.7 x 2.2 cm. Left ovary measures 2.1 x 0.9 x 1.4 cm. No pelvic free fluid. IMPRESSION: 1. No intrauterine pregnancy is identified. Differential diagnosis includes a pregnancy too early to detect versus missed abortion versus ectopic pregnancy. Recommend clinical correlation, serial quantitative beta HCGs, ectopic precautions, and followup ultrasound as  clinically indicated. Electronically Signed   By: Elige Ko   On: 03/24/2015 20:53   US Ob Transvaginal  03/24/2015  CLINICAL DATA:  Abdominal pain, pregnant EXAM: OBSTETRIC <14 WK [place] AND TRANSVAGINAL OB US TECHNIQUE: Both transabdominal and transvaginal ultrasound examinations were performed for complete evaluation of the gestation as well as the maternal uterus, adnexal regions, and pelvic cul-de-sac. Transvaginal technique was performed to assess early pregnancy. COMPARISON:  None. FINDINGS: Intrauterine gestational sac: Not present. Yolk sac:  Not present. Embryo:  Not present. Cardiac Activity: Not present. Maternal uterus/adnexae: No adnexal mass. Bilateral ovaries are normal in size and echogenicity. Small hypoechoic  right ovarian mass likely representing a corpus luteum cyst of pregnancy. Right ovary measures 4.5 x 2.7 x 2.2 cm. Left ovary measures 2.1 x 0.9 x 1.4 cm. No pelvic free fluid. IMPRESSION: 1. No intrauterine pregnancy is identified. Differential diagnosis includes a pregnancy too early to detect versus missed abortion versus ectopic pregnancy. Recommend clinical correlation, serial quantitative beta HCGs, ectopic precautions, and followup ultrasound as clinically indicated. Electronically Signed   By: Elige KoHetal  Patel   On: 03/24/2015 20:53    MDM UPT positive B positive BHCG 383 Ultrasound - no iup or ectopic.Marland Kitchen....will have pt f/u in 48 hrs for BHCG  Assessment and Plan  A: 1. Pregnancy of unknown anatomic location   2. Abdominal pain in pregnancy    P: Discharge home F/u in 48 hrs for BHCG Ectopic & SAB precautions Pregnancy verification letter & provider list given GC/CT & HIV pending  Judeth HornErin Mani Celestin, NP  03/24/2015, 6:12 PM

## 2015-03-24 NOTE — Discharge Instructions (Signed)
Ectopic Pregnancy An ectopic pregnancy happens when a fertilized egg grows outside the uterus. A pregnancy cannot live outside of the uterus. This problem often happens in the fallopian tube. It is often caused by damage to the fallopian tube. If this problem is found early, you may be treated with medicine. If your tube tears or bursts open (ruptures), you will bleed inside. This is an emergency. You will need surgery. Get help right away.  SYMPTOMS You may have normal pregnancy symptoms at first. These include:  Missing your period.  Feeling sick to your stomach (nauseous).  Being tired.  Having tender breasts. Then, you may start to have symptoms that are not normal. These include:  Pain with sex (intercourse).  Bleeding from the vagina. This includes light bleeding (spotting).  Belly (abdomen) or lower belly cramping or pain. This may be felt on one side.  A fast heartbeat (pulse).  Passing out (fainting) after going poop (bowel movement). If your tube tears, you may have symptoms such as:  Really bad pain in the belly or lower belly. This happens suddenly.  Dizziness.  Passing out.  Shoulder pain. GET HELP RIGHT AWAY IF:  You have any of these symptoms. This is an emergency. MAKE SURE YOU:  Understand these instructions.  Will watch your condition.  Will get help right away if you are not doing well or get worse.   This information is not intended to replace advice given to you by your health care provider. Make sure you discuss any questions you have with your health care provider.   Document Released: 08/23/2008 Document Revised: 06/01/2013 Document Reviewed: 01/06/2013 Elsevier Interactive Patient Education 2016 [employer].    Threatened Miscarriage A threatened miscarriage is when you have vaginal bleeding during your first 20 weeks of pregnancy but the pregnancy has not ended. Your doctor will do tests to make sure you are still pregnant. The cause of  the bleeding may not be known. This condition does not mean your pregnancy will end. It does increase the risk of it ending (complete miscarriage). HOME CARE   Make sure you keep all your doctor visits for prenatal care.  Get plenty of rest.  Do not have sex or use tampons if you have vaginal bleeding.  Do not douche.  Do not smoke or use drugs.  Do not drink alcohol.  Avoid caffeine. GET HELP IF:  You have light bleeding from your vagina.  You have belly pain or cramping.  You have a fever. GET HELP RIGHT AWAY IF:   You have heavy bleeding from your vagina.  You have clots of blood coming from your vagina.  You have bad pain or cramps in your low back or belly.  You have fever, chills, and bad belly pain. MAKE SURE YOU:   Understand these instructions.  Will watch your condition.  Will get help right away if you are not doing well or get worse.   This information is not intended to replace advice given to you by your health care provider. Make sure you discuss any questions you have with your health care provider.   Document Released: 05/09/2008 Document Revised: 06/01/2013 Document Reviewed: 03/23/2013 Elsevier Interactive Patient Education [employer]    First Trimester of Pregnancy The first trimester of pregnancy is from week 1 until the end of week 12 (months 1 through 3). During this time, your baby will begin to develop inside you. At 6-8 weeks, the eyes and face are formed, and  the heartbeat can be seen on ultrasound. At the end of 12 weeks, all the baby's organs are formed. Prenatal care is all the medical care you receive before the birth of your baby. Make sure you get good prenatal care and follow all of your doctor's instructions. HOME CARE  Medicines  Take medicine only as told by your doctor. Some medicines are safe and some are not during pregnancy.  Take your prenatal vitamins as told by your doctor.  Take medicine that helps you poop  (stool softener) as needed if your doctor says it is okay. Diet  Eat regular, healthy meals.  Your doctor will tell you the amount of weight gain that is right for you.  Avoid raw meat and uncooked cheese.  If you feel sick to your stomach (nauseous) or throw up (vomit):  Eat 4 or 5 small meals a day instead of 3 large meals.  Try eating a few soda crackers.  Drink liquids between meals instead of during meals.  If you have a hard time pooping (constipation):  Eat high-fiber foods like fresh vegetables, fruit, and whole grains.  Drink enough fluids to keep your pee (urine) clear or pale yellow. Activity and Exercise  Exercise only as told by your doctor. Stop exercising if you have cramps or pain in your lower belly (abdomen) or low back.  Try to avoid standing for long periods of time. Move your legs often if you must stand in one place for a long time.  Avoid heavy lifting.  Wear low-heeled shoes. Sit and stand up straight.  You can have sex unless your doctor tells you not to. Relief of Pain or Discomfort  Wear a good support bra if your breasts are sore.  Take warm water baths (sitz baths) to soothe pain or discomfort caused by hemorrhoids. Use hemorrhoid cream if your doctor says it is okay.  Rest with your legs raised if you have leg cramps or low back pain.  Wear support hose if you have puffy, bulging veins (varicose veins) in your legs. Raise (elevate) your feet for 15 minutes, 3-4 times a day. Limit salt in your diet. Prenatal Care  Schedule your prenatal visits by the twelfth week of pregnancy.  Write down your questions. Take them to your prenatal visits.  Keep all your prenatal visits as told by your doctor. Safety  Wear your seat belt at all times when driving.  Make a list of emergency phone numbers. The list should include numbers for family, friends, the hospital, and police and fire departments. General Tips  Ask your doctor for a referral to  a local prenatal class. Begin classes no later than at the start of month 6 of your pregnancy.  Ask for help if you need counseling or help with nutrition. Your doctor can give you advice or tell you where to go for help.  Do not use hot tubs, steam rooms, or saunas.  Do not douche or use tampons or scented sanitary pads.  Do not cross your legs for long periods of time.  Avoid litter boxes and soil used by cats.  Avoid all smoking, herbs, and alcohol. Avoid drugs not approved by your doctor.  Do not use any tobacco products, including cigarettes, chewing tobacco, and electronic cigarettes. If you need help quitting, ask your doctor. You may get counseling or other support to help you quit.  Visit your dentist. At home, brush your teeth with a soft toothbrush. Be gentle when you floss. GET  HELP IF:  You are dizzy.  You have mild cramps or pressure in your lower belly.  You have a nagging pain in your belly area.  You continue to feel sick to your stomach, throw up, or have watery poop (diarrhea).  You have a bad smelling fluid coming from your vagina.  You have pain with peeing (urination).  You have increased puffiness (swelling) in your face, hands, legs, or ankles. GET HELP RIGHT AWAY IF:   You have a fever.  You are leaking fluid from your vagina.  You have spotting or bleeding from your vagina.  You have very bad belly cramping or pain.  You gain or lose weight rapidly.  You throw up blood. It may look like coffee grounds.  You are around people who have [place] measles, fifth disease, or chickenpox.  You have a very bad headache.  You have shortness of breath.  You have any kind of trauma, such as from a fall or a car accident.   This information is not intended to replace advice given to you by your health care provider. Make sure you discuss any questions you have with your health care provider.   Document Released: 11/13/2007 Document Revised:  06/17/2014 Document Reviewed: 04/06/2013 Elsevier Interactive Patient Education 2016 [employer].    Safe Medications in Pregnancy   Acne: Benzoyl Peroxide Salicylic Acid  Backache/Headache: Tylenol: 2 regular strength every 4 hours OR              2 Extra strength every 6 hours  Colds/Coughs/Allergies: Benadryl (alcohol free) 25 mg every 6 hours as needed Breath right strips Claritin Cepacol throat lozenges Chloraseptic throat spray Cold-Eeze- up to three times per day Cough drops, alcohol free Flonase (by prescription only) Guaifenesin Mucinex Robitussin DM (plain only, alcohol free) Saline nasal spray/drops Sudafed (pseudoephedrine) & Actifed ** use only after [redacted] weeks gestation and if you do not have high blood pressure Tylenol Vicks Vaporub Zinc lozenges Zyrtec   Constipation: Colace Ducolax suppositories Fleet enema Glycerin suppositories Metamucil Milk of magnesia Miralax Senokot Smooth move tea  Diarrhea: Kaopectate Imodium A-D  *NO pepto Bismol  Hemorrhoids: Anusol Anusol HC Preparation H Tucks  Indigestion: Tums Maalox Mylanta Zantac  Pepcid  Insomnia: Benadryl (alcohol free)  every 6 hours as needed Tylenol PM Unisom, no Gelcaps  Leg Cramps: Tums MagGel  Nausea/Vomiting:  Bonine Dramamine Emetrol Ginger extract Sea bands Meclizine  Nausea medication to take during pregnancy:  Unisom (doxylamine succinate 25 mg tablets) Take one tablet daily at bedtime. If symptoms are not adequately controlled, the dose can be increased to a maximum recommended dose of two tablets daily (1/2 tablet in the morning, 1/2 tablet mid-afternoon and one at bedtime). Vitamin B6  tablets. Take one tablet twice a day (up to 200 mg per day).  Skin Rashes: Aveeno products Benadryl cream or  every 6 hours as needed Calamine Lotion 1% cortisone cream  Yeast infection: Gyne-lotrimin 7 Monistat 7   **If taking multiple  medications, please check labels to avoid duplicating the same active ingredients **take medication as directed on the label ** Do not exceed 4000 mg of tylenol in 24 hours **Do not take medications that contain aspirin or ibuprofen    [facility]   Francoise Ceo      Phone: [phone]  [facility]     Phone: [phone]  [facility] [place]  Phone: [phone]  [facility] [place]  Phone:  [phone]  [facility]    Phone: [phone]   [facility]([place]    Phone: [phone]  [facility]    Phone: [phone]  [facility]    Phone: [phone]  [facility]    Phone: [phone]  [facility] Phone: [phone]   [facility]  Phone: [phone]  [facility]    Phone: [phone]  [facility]   Phone: [phone]  [facility]      Phone: [phone]  [facility]    Phone: [phone]  [facility]     Phone: [phone]

## 2015-03-24 NOTE — MAU Note (Signed)
Been having some abd pain, when uses restroom it "feels weird", kind of pressure, not painful.  Thought it might be a UTI

## 2015-03-25 LAB — HIV ANTIBODY (ROUTINE TESTING W REFLEX): HIV SCREEN 4TH GENERATION: NONREACTIVE

## 2015-03-26 ENCOUNTER — Encounter (HOSPITAL_COMMUNITY): Payer: Self-pay | Admitting: *Deleted

## 2015-03-26 ENCOUNTER — Inpatient Hospital Stay (HOSPITAL_COMMUNITY)
Admission: AD | Admit: 2015-03-26 | Discharge: 2015-03-26 | Disposition: A | Discharge status: Home / Self Care | Payer: Medicaid Other | Source: Ambulatory Visit | Attending: Obstetrics & Gynecology | Admitting: Obstetrics & Gynecology

## 2015-03-26 DIAGNOSIS — O9989 Other specified diseases and conditions complicating pregnancy, childbirth and the puerperium: Secondary | ICD-10-CM | POA: Diagnosis not present

## 2015-03-26 DIAGNOSIS — O26891 Other specified pregnancy related conditions, first trimester: Secondary | ICD-10-CM | POA: Insufficient documentation

## 2015-03-26 DIAGNOSIS — R109 Unspecified abdominal pain: Secondary | ICD-10-CM

## 2015-03-26 DIAGNOSIS — Z3A01 Less than 8 weeks gestation of pregnancy: Secondary | ICD-10-CM | POA: Diagnosis not present

## 2015-03-26 DIAGNOSIS — Z87891 Personal history of nicotine dependence: Secondary | ICD-10-CM | POA: Diagnosis not present

## 2015-03-26 DIAGNOSIS — O26899 Other specified pregnancy related conditions, unspecified trimester: Secondary | ICD-10-CM

## 2015-03-26 HISTORY — DX: Other specified health status: Z78.9

## 2015-03-26 LAB — HCG, QUANTITATIVE, PREGNANCY: hCG, Beta Chain, Quant, S: 949 m[IU]/mL — ABNORMAL HIGH (ref <5)

## 2015-03-26 NOTE — MAU Note (Signed)
Patient presents at [redacted] weeks gestation for repeat labwork. Denies pain, bleeding or discharge. 

## 2015-03-26 NOTE — Discharge Instructions (Signed)

## 2015-03-26 NOTE — MAU Provider Note (Signed)
History     CSN: [id number]  Arrival date and time: 03/26/15 1418   First Provider Initiated Contact with Patient 03/26/15 1556      Chief Complaint  Patient presents with  . Repeat Labwork    HPI This is a 24 y.o. female at [redacted]w[redacted]d who presents for followup HCG level.  She was seen on 03/24/15 for abdominal pain.  Quant HCG was 383 that day. States has had no more pain since then. Denies bleeding  RN Note: Patient presents at [redacted] weeks gestation for repeat labwork. Denies pain, bleeding or discharge  OB History    Gravida Para Term Preterm AB TAB SAB Ectopic Multiple Living   0 2 1 0 1 0 1      Past Medical History  Diagnosis Date  . No pertinent past medical history     hx ectopic pregnancy  . Medical history non-contributory     Past Surgical History  Procedure Laterality Date  . Therapeutic abortion      Family History  Problem Relation Age of Onset  . Anesthesia problems Neg Hx     Social History  Substance Use Topics  . Smoking status: Former Smoker    Types: Cigarettes    Quit date: 06/02/2014  . Smokeless tobacco: Never Used  . Alcohol Use: Yes     Comment: socially    Allergies: No Known Allergies  Prescriptions prior to admission  Medication Sig Dispense Refill Last Dose  . Prenatal Vit-Fe Fumarate-FA (PRENATAL VITAMINS PLUS) 27-1 MG TABS Take 1 tablet by mouth 1 day or 1 dose. 30 tablet 3 03/23/2015 at Unknown time   Medical, Surgical, Family and Social histories reviewed and are listed above.  Medications and allergies reviewed.   Review of Systems  Constitutional: Negative for fever, chills and malaise/fatigue.  Gastrointestinal: Negative for nausea, vomiting and abdominal pain.  Musculoskeletal: Negative for back pain.  Neurological: Negative for headaches.   Physical Exam   Blood pressure 102/67, pulse 96, temperature 98.3 F (36.8 C), temperature source Oral, resp. rate 16, height  (1.676 m), weight 125 lb 8 oz (56.926 kg),  last menstrual period 02/18/2015, unknown if currently breastfeeding.  Physical Exam  Constitutional: She is oriented to person, place, and time. She appears well-developed and well-nourished. No distress.  Cardiovascular: Normal rate and regular rhythm.   Respiratory: Effort normal. No respiratory distress.  Genitourinary:  Exam not indicated  Musculoskeletal: Normal range of motion.  Neurological: She is alert and oriented to person, place, and time.  Skin: Skin is warm and dry.  Psychiatric: She has a normal mood and affect.    MAU Course  Procedures  MDM Quant HCG level drawn. This pain could have represented a normal pregnancy with bleeding, spontaneous abortion or even an ectopic which can be life-threatening.   Cultures were done to rule out pelvic infection at last visit.   Results for IRIE, DOWSON (MRN [id number]) as of 03/26/2015 15:56  Ref. Range 03/24/2015 18:12 03/24/2015 18:25 03/24/2015 20:49 03/26/2015 15:00  HCG, Beta Chain, Quant, S Latest Ref Range: <5 mIU/mL 383 (H)   949 (H)   Assessment and Plan  A:  Pregnancy at [redacted]w[redacted]d by LMP      Appropriate rise in HCG level      Pregnancy of unknown location  P:   Discussed findings        WIll order f/u US for next week        Ectopic precautions  Wynelle BourgeoisWILLIAMS,MARIE, CNM 03/26/2015, 3:56 PM   Attestation of Attending Supervision of Advanced Practice Provider (PA/CNM/NP): Evaluation and management procedures were performed by the Advanced Practice Provider under my supervision and collaboration.  I have reviewed the Advanced Practice Provider's note and chart, and I agree with the management and plan.  Jaynie CollinsUGONNA  Kobee Medlen, MD, FACOG Attending Obstetrician & Gynecologist Faculty Practice, [facility]

## 2015-03-27 LAB — GC/CHLAMYDIA PROBE AMP (~~LOC~~) NOT AT ARMC
CHLAMYDIA, DNA PROBE: NEGATIVE
Neisseria Gonorrhea: NEGATIVE

## 2015-03-31 ENCOUNTER — Ambulatory Visit (HOSPITAL_COMMUNITY): Payer: Medicaid Other

## 2015-03-31 ENCOUNTER — Ambulatory Visit (HOSPITAL_COMMUNITY)
Admission: RE | Admit: 2015-03-31 | Discharge: 2015-03-31 | Disposition: A | Discharge status: Home / Self Care | Payer: Medicaid Other | Source: Ambulatory Visit | Attending: Advanced Practice Midwife | Admitting: Advanced Practice Midwife

## 2015-03-31 ENCOUNTER — Inpatient Hospital Stay (HOSPITAL_COMMUNITY)
Admission: AD | Admit: 2015-03-31 | Discharge: 2015-03-31 | Disposition: A | Discharge status: Home / Self Care | Payer: Medicaid Other | Source: Ambulatory Visit | Attending: Obstetrics and Gynecology | Admitting: Obstetrics and Gynecology

## 2015-03-31 DIAGNOSIS — Z3A01 Less than 8 weeks gestation of pregnancy: Secondary | ICD-10-CM | POA: Insufficient documentation

## 2015-03-31 DIAGNOSIS — R102 Pelvic and perineal pain: Secondary | ICD-10-CM | POA: Diagnosis present

## 2015-03-31 DIAGNOSIS — O26899 Other specified pregnancy related conditions, unspecified trimester: Secondary | ICD-10-CM

## 2015-03-31 DIAGNOSIS — Z3491 Encounter for supervision of normal pregnancy, unspecified, first trimester: Secondary | ICD-10-CM

## 2015-03-31 DIAGNOSIS — O208 Other hemorrhage in early pregnancy: Secondary | ICD-10-CM | POA: Diagnosis not present

## 2015-03-31 DIAGNOSIS — R109 Unspecified abdominal pain: Secondary | ICD-10-CM

## 2015-03-31 DIAGNOSIS — O26891 Other specified pregnancy related conditions, first trimester: Secondary | ICD-10-CM

## 2015-03-31 DIAGNOSIS — R1084 Generalized abdominal pain: Secondary | ICD-10-CM | POA: Diagnosis not present

## 2015-03-31 NOTE — Discharge Instructions (Signed)
First Trimester of Pregnancy The first trimester of pregnancy is from week 1 until the end of week 12 (months 1 through 3). A week after a sperm fertilizes an egg, the egg will implant on the wall of the uterus. This embryo will begin to develop into a baby. Genes from you and your partner are forming the baby. The female genes determine whether the baby is a boy or a girl. At 6-8 weeks, the eyes and face are formed, and the heartbeat can be seen on ultrasound. At the end of 12 weeks, all the baby's organs are formed.  Now that you are pregnant, you will want to do everything you can to have a healthy baby. Two of the most important things are to get good prenatal care and to follow your health care provider's instructions. Prenatal care is all the medical care you receive before the baby's birth. This care will help prevent, find, and treat any problems during the pregnancy and childbirth. BODY CHANGES Your body goes through many changes during pregnancy. The changes vary from woman to woman.   You may gain or lose a couple of pounds at first.  You may feel sick to your stomach (nauseous) and throw up (vomit). If the vomiting is uncontrollable, call your health care provider.  You may tire easily.  You may develop headaches that can be relieved by medicines approved by your health care provider.  You may urinate more often. Painful urination may mean you have a bladder infection.  You may develop heartburn as a result of your pregnancy.  You may develop constipation because certain hormones are causing the muscles that push waste through your intestines to slow down.  You may develop hemorrhoids or swollen, bulging veins (varicose veins).  Your breasts may begin to grow larger and become tender. Your nipples may stick out more, and the tissue that surrounds them (areola) may become darker.  Your gums may bleed and may be sensitive to brushing and flossing.  Dark spots or blotches (chloasma,  mask of pregnancy) may develop on your face. This will likely fade after the baby is born.  Your menstrual periods will stop.  You may have a loss of appetite.  You may develop cravings for certain kinds of food.  You may have changes in your emotions from day to day, such as being excited to be pregnant or being concerned that something may go wrong with the pregnancy and baby.  You may have more vivid and strange dreams.  You may have changes in your hair. These can include thickening of your hair, rapid growth, and changes in texture. Some women also have hair loss during or after pregnancy, or hair that feels dry or thin. Your hair will most likely return to normal after your baby is born. WHAT TO EXPECT AT YOUR PRENATAL VISITS During a routine prenatal visit:  You will be weighed to make sure you and the baby are growing normally.  Your blood pressure will be taken.  Your abdomen will be measured to track your baby's growth.  The fetal heartbeat will be listened to starting around week 10 or 12 of your pregnancy.  Test results from any previous visits will be discussed. Your health care provider may ask you:  How you are feeling.  If you are feeling the baby move.  If you have had any abnormal symptoms, such as leaking fluid, bleeding, severe headaches, or abdominal cramping.  If you are using any tobacco products,   including cigarettes, chewing tobacco, and electronic cigarettes.  If you have any questions. Other tests that may be performed during your first trimester include:  Blood tests to find your blood type and to check for the presence of any previous infections. They will also be used to check for low iron levels (anemia) and Rh antibodies. Later in the pregnancy, blood tests for diabetes will be done along with other tests if problems develop.  Urine tests to check for infections, diabetes, or protein in the urine.  An ultrasound to confirm the proper growth  and development of the baby.  An amniocentesis to check for possible genetic problems.  Fetal screens for spina bifida and Down syndrome.  You may need other tests to make sure you and the baby are doing well.  HIV (human immunodeficiency virus) testing. Routine prenatal testing includes screening for HIV, unless you choose not to have this test. HOME CARE INSTRUCTIONS  Medicines  Follow your health care provider's instructions regarding medicine use. Specific medicines may be either safe or unsafe to take during pregnancy.  Take your prenatal vitamins as directed.  If you develop constipation, try taking a stool softener if your health care provider approves. Diet  Eat regular, well-balanced meals. Choose a variety of foods, such as meat or vegetable-based protein, fish, milk and low-fat dairy products, vegetables, fruits, and whole grain breads and cereals. Your health care provider will help you determine the amount of weight gain that is right for you.  Avoid raw meat and uncooked cheese. These carry germs that can cause birth defects in the baby.  Eating four or five small meals rather than three large meals a day may help relieve nausea and vomiting. If you start to feel nauseous, eating a few soda crackers can be helpful. Drinking liquids between meals instead of during meals also seems to help nausea and vomiting.  If you develop constipation, eat more high-fiber foods, such as fresh vegetables or fruit and whole grains. Drink enough fluids to keep your urine clear or pale yellow. Activity and Exercise  Exercise only as directed by your health care provider. Exercising will help you:  Control your weight.  Stay in shape.  Be prepared for labor and delivery.  Experiencing pain or cramping in the lower abdomen or low back is a good sign that you should stop exercising. Check with your health care provider before continuing normal exercises.  Try to avoid standing for long  periods of time. Move your legs often if you must stand in one place for a long time.  Avoid heavy lifting.  Wear low-heeled shoes, and practice good posture.  You may continue to have sex unless your health care provider directs you otherwise. Relief of Pain or Discomfort  Wear a good support bra for breast tenderness.   Take warm sitz baths to soothe any pain or discomfort caused by hemorrhoids. Use hemorrhoid cream if your health care provider approves.   Rest with your legs elevated if you have leg cramps or low back pain.  If you develop varicose veins in your legs, wear support hose. Elevate your feet for 15 minutes, 3-4 times a day. Limit salt in your diet. Prenatal Care  Schedule your prenatal visits by the twelfth week of pregnancy. They are usually scheduled monthly at first, then more often in the last 2 months before delivery.  Write down your questions. Take them to your prenatal visits.  Keep all your prenatal visits as directed by your   health care provider. Safety  Wear your seat belt at all times when driving.  Make a list of emergency phone numbers, including numbers for family, friends, the hospital, and police and fire departments. General Tips  Ask your health care provider for a referral to a local prenatal education class. Begin classes no later than at the beginning of month 6 of your pregnancy.  Ask for help if you have counseling or nutritional needs during pregnancy. Your health care provider can offer advice or refer you to specialists for help with various needs.  Do not use hot tubs, steam rooms, or saunas.  Do not douche or use tampons or scented sanitary pads.  Do not cross your legs for long periods of time.  Avoid cat litter boxes and soil used by cats. These carry germs that can cause birth defects in the baby and possibly loss of the fetus by miscarriage or stillbirth.  Avoid all smoking, herbs, alcohol, and medicines not prescribed by  your health care provider. Chemicals in these affect the formation and growth of the baby.  Do not use any tobacco products, including cigarettes, chewing tobacco, and electronic cigarettes. If you need help quitting, ask your health care provider. You may receive counseling support and other resources to help you quit.  Schedule a dentist appointment. At home, brush your teeth with a soft toothbrush and be gentle when you floss. SEEK MEDICAL CARE IF:   You have dizziness.  You have mild pelvic cramps, pelvic pressure, or nagging pain in the abdominal area.  You have persistent nausea, vomiting, or diarrhea.  You have a bad smelling vaginal discharge.  You have pain with urination.  You notice increased swelling in your face, hands, legs, or ankles. SEEK IMMEDIATE MEDICAL CARE IF:   You have a fever.  You are leaking fluid from your vagina.  You have spotting or bleeding from your vagina.  You have severe abdominal cramping or pain.  You have rapid weight gain or loss.  You vomit blood or material that looks like coffee grounds.  You are exposed to German measles and have never had them.  You are exposed to fifth disease or chickenpox.  You develop a severe headache.  You have shortness of breath.  You have any kind of trauma, such as from a fall or a car accident.   This information is not intended to replace advice given to you by your health care provider. Make sure you discuss any questions you have with your health care provider.   Document Released: 05/21/2001 Document Revised: 06/17/2014 Document Reviewed: 04/06/2013 Elsevier Interactive Patient Education 2016 [employer]  

## 2015-03-31 NOTE — [facility] Provider Note (Signed)
S: 24 y.o. [id number] @[redacted]w[redacted]d  by LMP/U/S presents to [facility] for follow up results after scheduled outpatient ultrasound. She denies pain or bleeding today.  She presented initially with abdominal pain and positive pregnancy test on 03/24/15. Quant HCG was 383 that day. Repeat quant hcg was >900 with appropriate rise in 48 hours.  O:  VS reviewed, nursing note reviewed,  Constitutional: well developed, well nourished, no distress HEENT: normocephalic CV: normal rate Pulm/chest wall: normal effort Abdomen: soft Neuro: alert and oriented x 3 Skin: warm, dry Psych: affect normal  [place] Ob Transvaginal  03/31/2015  CLINICAL DATA:  Subsequent encounter for pelvic pain with positive pregnancy test. EXAM: TRANSVAGINAL OB ULTRASOUND TECHNIQUE: Transvaginal ultrasound was performed for complete evaluation of the gestation as well as the maternal uterus, adnexal regions, and pelvic cul-de-sac. COMPARISON:  03/24/2015. FINDINGS: Intrauterine gestational sac: Visualized/normal in shape. Yolk sac:  Visualized Embryo:  Not visualized Cardiac Activity: Nonvisualized MSD: 8  Mm   5 w   4  d Maternal uterus/adnexae: Small subchronic hemorrhage noted. No evidence for adnexal mass.Trace intraperitoneal free fluid is evident. IMPRESSION: Single intrauterine gestational sac with estimated 5 week 4 day gestational age by mean sac diameter. Yolk sac is visible but no embryo is yet apparent. Electronically Signed   By: Kennith CenterEric  Mansell M.D.   On: 03/31/2015 15:27   --/--/B POS (10/14 1812)  A:  1. Pelvic pain affecting pregnancy in first trimester, antepartum   2. Normal IUP (intrauterine pregnancy) on prenatal ultrasound, first trimester    P: D/C home Follow up with early prenatal care First trimester precautions given Return to MAU as needed for emergencies   LEFTWICH-KIRBY, Contrina [place], CNM 8:32 AM

## 2015-05-03 ENCOUNTER — Encounter: Payer: Self-pay | Admitting: Obstetrics

## 2015-05-03 ENCOUNTER — Ambulatory Visit (INDEPENDENT_AMBULATORY_CARE_PROVIDER_SITE_OTHER): Payer: Medicaid Other | Admitting: Obstetrics

## 2015-05-03 VITALS — BP 111/76 | HR 98 | Temp 99.4°F | Wt 126.0 lb

## 2015-05-03 DIAGNOSIS — Z3481 Encounter for supervision of other normal pregnancy, first trimester: Secondary | ICD-10-CM | POA: Diagnosis not present

## 2015-05-03 LAB — POCT URINALYSIS DIPSTICK
Bilirubin, UA: NEGATIVE
Blood, UA: NEGATIVE
GLUCOSE UA: NEGATIVE
KETONES UA: NEGATIVE
Leukocytes, UA: NEGATIVE
Nitrite, UA: NEGATIVE
Protein, UA: NEGATIVE
SPEC GRAV UA: 1.01
Urobilinogen, UA: NEGATIVE
pH, UA: 7

## 2015-05-03 LAB — TSH: TSH: 0.687 u[IU]/mL (ref 0.350–4.500)

## 2015-05-04 LAB — HIV ANTIBODY (ROUTINE TESTING W REFLEX): HIV 1&2 Ab, 4th Generation: NONREACTIVE

## 2015-05-04 LAB — VITAMIN D 25 HYDROXY (VIT D DEFICIENCY, FRACTURES): Vit D, 25-Hydroxy: 21 ng/mL — ABNORMAL LOW (ref 30–100)

## 2015-05-05 ENCOUNTER — Encounter: Payer: Self-pay | Admitting: Obstetrics

## 2015-05-05 LAB — OBSTETRIC PANEL
Antibody Screen: NEGATIVE
BASOS PCT: 0 % (ref 0–1)
Basophils Absolute: 0 10*3/uL (ref 0.0–0.1)
EOS ABS: 0.1 10*3/uL (ref 0.0–0.7)
EOS PCT: 1 % (ref 0–5)
HEMATOCRIT: 35.4 % — AB (ref 36.0–46.0)
HEMOGLOBIN: 12.3 g/dL (ref 12.0–15.0)
Hepatitis B Surface Ag: NEGATIVE
Lymphocytes Relative: 27 % (ref 12–46)
Lymphs Abs: 1.9 10*3/uL (ref 0.7–4.0)
MCH: 29.4 pg (ref 26.0–34.0)
MCHC: 34.7 g/dL (ref 30.0–36.0)
MCV: 84.7 fL (ref 78.0–100.0)
MONO ABS: 0.6 10*3/uL (ref 0.1–1.0)
MONOS PCT: 8 % (ref 3–12)
MPV: 9.5 fL (ref 8.6–12.4)
Neutro Abs: 4.5 10*3/uL (ref 1.7–7.7)
Neutrophils Relative %: 64 % (ref 43–77)
Platelets: 312 10*3/uL (ref 150–400)
RBC: 4.18 MIL/uL (ref 3.87–5.11)
RDW: 13.1 % (ref 11.5–15.5)
RH TYPE: POSITIVE
Rubella: 1.06 Index — ABNORMAL HIGH (ref <0.90)
WBC: 7.1 10*3/uL (ref 4.0–10.5)

## 2015-05-05 LAB — CULTURE, OB URINE
COLONY COUNT: NO GROWTH
ORGANISM ID, BACTERIA: NO GROWTH

## 2015-05-05 LAB — VARICELLA ZOSTER ANTIBODY, IGG: Varicella IgG: 235.4 Index — ABNORMAL HIGH (ref <135.00)

## 2015-05-05 NOTE — Progress Notes (Signed)
Subjective:    Elizabeth [name] is being seen today for her first obstetrical visit.  This is not a planned pregnancy. She is at [redacted]w[redacted]d gestation. Her obstetrical history is significant for none. Relationship with FOB: significant other, living together. Patient does intend to breast feed. Pregnancy history fully reviewed.  The information documented in the HPI was reviewed and verified.  Menstrual History: OB History    Gravida Para Term Preterm AB TAB SAB Ectopic Multiple Living   4 1 1  0 2 1 0 1 0 1       Patient's last menstrual period was 02/18/2015.    Past Medical History  Diagnosis Date  . No pertinent past medical history     hx ectopic pregnancy  . Medical history non-contributory     Past Surgical History  Procedure Laterality Date  . Therapeutic abortion       (Not in a hospital admission) No Known Allergies  Social History  Substance Use Topics  . Smoking status: Former Smoker    Types: Cigarettes    Quit date: 06/02/2014  . Smokeless tobacco: Never Used  . Alcohol Use: No     Comment: socially    Family History  Problem Relation Age of Onset  . Anesthesia problems Neg Hx   . Hearing loss Mother   . Thyroid disease Mother      Review of Systems Constitutional: negative for weight loss Gastrointestinal: negative for vomiting Genitourinary:negative for genital lesions and vaginal discharge and dysuria Musculoskeletal:negative for back pain Behavioral/Psych: negative for abusive relationship, depression, illegal drug usage and tobacco use    Objective:    BP 111/76 mmHg  Pulse 98  Temp(Src) 99.4 F (37.4 C)  Wt 126 lb (57.153 kg)  LMP 02/18/2015 General Appearance:    Alert, cooperative, no distress, appears stated age  Head:    Normocephalic, without obvious abnormality, atraumatic  Eyes:    PERRL, conjunctiva/corneas clear, EOM's intact, fundi    benign, both eyes  Ears:    Normal TM's and external ear canals, both ears  Nose:   Nares normal,  septum midline, mucosa normal, no drainage    or sinus tenderness  Throat:   Lips, mucosa, and tongue normal; teeth and gums normal  Neck:   Supple, symmetrical, trachea midline, no adenopathy;    thyroid:  no enlargement/tenderness/nodules; no carotid   bruit or JVD  Back:     Symmetric, no curvature, ROM normal, no CVA tenderness  Lungs:     Clear to auscultation bilaterally, respirations unlabored  Chest Wall:    No tenderness or deformity   Heart:    Regular rate and rhythm, S1 and S2 normal, no murmur, rub   or gallop  Breast Exam:    No tenderness, masses, or nipple abnormality  Abdomen:     Soft, non-tender, bowel sounds active all four quadrants,    no masses, no organomegaly  Genitalia:    Normal female without lesion, discharge or tenderness  Extremities:   Extremities normal, atraumatic, no cyanosis or edema  Pulses:   2+ and symmetric all extremities  Skin:   Skin color, texture, turgor normal, no rashes or lesions  Lymph nodes:   Cervical, supraclavicular, and axillary nodes normal  Neurologic:   CNII-XII intact, normal strength, sensation and reflexes    throughout      Lab Review Urine pregnancy test Labs reviewed yes Radiologic studies reviewed no Assessment:    Pregnancy at 324w6d weeks    Plan:  Prenatal vitamins.  Counseling provided regarding continued use of seat belts, cessation of alcohol consumption, smoking or use of illicit drugs; infection precautions i.e., influenza/TDAP immunizations, toxoplasmosis,CMV, parvovirus, listeria and varicella; workplace safety, exercise during pregnancy; routine dental care, safe medications, sexual activity, hot tubs, saunas, pools, travel, caffeine use, fish and methlymercury, potential toxins, hair treatments, varicose veins Weight gain recommendations per IOM guidelines reviewed: underweight/BMI< 18.5--> gain 28 - 40 lbs; normal weight/BMI 18.5 - 24.9--> gain 25 - 35 lbs; overweight/BMI 25 - 29.9--> gain 15 - 25 lbs;  obese/BMI >30->gain  11 - 20 lbs Problem list reviewed and updated. FIRST/CF mutation testing/NIPT/QUAD SCREEN/fragile X/Ashkenazi Jewish population testing/Spinal muscular atrophy discussed: requested. Role of ultrasound in pregnancy discussed; fetal survey: requested. Amniocentesis discussed: not indicated. VBAC calculator score: VBAC consent form provided No orders of the defined types were placed in this encounter.   Orders Placed This Encounter  Procedures  . Culture, OB Urine  . Obstetric panel  . HIV antibody  . Hemoglobinopathy evaluation  . Varicella zoster antibody, IgG  . VITAMIN D 25 Hydroxy (Vit-D Deficiency, Fractures)  . TSH  . POCT urinalysis dipstick    Follow up in 4 weeks.

## 2015-05-08 LAB — HEMOGLOBINOPATHY EVALUATION
HEMOGLOBIN OTHER: 0 %
HGB A2 QUANT: 2.7 % (ref 2.2–3.2)
HGB A: 95.8 % — AB (ref 96.8–97.8)
HGB S QUANTITAION: 0 %
Hgb F Quant: 1.5 % (ref 0.0–2.0)

## 2015-05-12 ENCOUNTER — Other Ambulatory Visit: Payer: Self-pay | Admitting: Certified Nurse Midwife

## 2015-05-30 ENCOUNTER — Ambulatory Visit (INDEPENDENT_AMBULATORY_CARE_PROVIDER_SITE_OTHER): Payer: Medicaid Other | Admitting: Obstetrics

## 2015-05-30 VITALS — BP 111/68 | HR 80 | Temp 98.7°F | Wt 130.0 lb

## 2015-05-30 DIAGNOSIS — Z3482 Encounter for supervision of other normal pregnancy, second trimester: Secondary | ICD-10-CM

## 2015-05-31 ENCOUNTER — Encounter: Payer: Self-pay | Admitting: Obstetrics

## 2015-05-31 NOTE — Progress Notes (Signed)
  Subjective:    Elizabeth [name] is a 24 y.o. female being seen today for her obstetrical visit. She is at 4954w4d gestation. Patient reports: no complaints.  Problem List Items Addressed This Visit    None     There are no active problems to display for this patient.   Objective:     BP 111/68 mmHg  Pulse 80  Temp(Src) 98.7 F (37.1 C)  Wt 130 lb (58.968 kg)  LMP 02/18/2015 Uterine Size: Below umbilicus     Assessment:    Pregnancy @ 6454w4d  weeks Doing well    Plan:    Problem list reviewed and updated. Labs reviewed.  Follow up in 4 weeks. FIRST/CF mutation testing/NIPT/QUAD SCREEN/fragile X/Ashkenazi Jewish population testing/Spinal muscular atrophy discussed: requested. Role of ultrasound in pregnancy discussed; fetal survey: requested. Amniocentesis discussed: not indicated.

## 2015-06-11 NOTE — L&D Delivery Note (Signed)
Delivery Note At 2:41 AM a viable female was delivered via  (Presentation: ;  ).  APGAR: , ; weight  .   Placenta status: , .  Cord:  with the following complications: .  Cord pH: not done  Anesthesia:   Episiotomy:   Lacerations:   Suture Repair: 2.0 Est. Blood Loss (mL):    Mom to postpartum.  Baby to Couplet care / Skin to Skin.  Linnie Delgrande A 11/21/2015, 2:48 AM

## 2015-06-27 ENCOUNTER — Encounter: Payer: Self-pay | Admitting: Obstetrics

## 2015-06-27 ENCOUNTER — Ambulatory Visit (INDEPENDENT_AMBULATORY_CARE_PROVIDER_SITE_OTHER): Payer: Medicaid Other | Admitting: Obstetrics

## 2015-06-27 VITALS — BP 100/61 | HR 81 | Temp 98.3°F | Wt 133.6 lb

## 2015-06-27 DIAGNOSIS — K219 Gastro-esophageal reflux disease without esophagitis: Secondary | ICD-10-CM

## 2015-06-27 DIAGNOSIS — Z3482 Encounter for supervision of other normal pregnancy, second trimester: Secondary | ICD-10-CM

## 2015-06-27 LAB — POCT URINALYSIS DIPSTICK
Bilirubin, UA: NEGATIVE
GLUCOSE UA: NEGATIVE
Ketones, UA: NEGATIVE
LEUKOCYTES UA: NEGATIVE
Nitrite, UA: NEGATIVE
Protein, UA: NEGATIVE
RBC UA: NEGATIVE
SPEC GRAV UA: 1.015
UROBILINOGEN UA: NEGATIVE
pH, UA: 6

## 2015-06-27 MED ORDER — RANITIDINE HCL 150 MG PO TABS: 150.0000 mg | ORAL_TABLET | Freq: Two times a day (BID) | ORAL | Status: DC

## 2015-06-27 NOTE — Progress Notes (Signed)
Subjective:    Elizabeth [name] is a 25 y.o. female being seen today for her obstetrical visit. She is at [redacted]w[redacted]d gestation. Patient reports: heartburn . Fetal movement: normal.  Problem List Items Addressed This Visit    None    Visit Diagnoses    Encounter for supervision of other normal pregnancy in second trimester    -  Primary    Relevant Orders    POCT urinalysis dipstick    US OB Comp + 14 Wk    GERD without esophagitis        Relevant Medications    ranitidine (ZANTAC) 150 MG tablet      There are no active problems to display for this patient.  Objective:    BP 100/61 mmHg  Pulse 81  Temp(Src) 98.3 F (36.8 C)  Wt 133 lb 9.6 oz (60.601 kg)  LMP 02/18/2015 FHT: 150 BPM  Uterine Size: size equals dates     Assessment:    Pregnancy @ [redacted]w[redacted]d    Plan:    Signs and symptoms of preterm labor: discussed.  Labs, problem list reviewed and updated 2 hr GTT planned Follow up in 4 weeks.

## 2015-07-06 ENCOUNTER — Other Ambulatory Visit: Payer: Self-pay | Admitting: Obstetrics

## 2015-07-06 ENCOUNTER — Ambulatory Visit (INDEPENDENT_AMBULATORY_CARE_PROVIDER_SITE_OTHER): Payer: Medicaid Other

## 2015-07-06 DIAGNOSIS — Z36 Encounter for antenatal screening of mother: Secondary | ICD-10-CM | POA: Diagnosis not present

## 2015-07-06 DIAGNOSIS — Z3482 Encounter for supervision of other normal pregnancy, second trimester: Secondary | ICD-10-CM

## 2015-07-06 DIAGNOSIS — Z3492 Encounter for supervision of normal pregnancy, unspecified, second trimester: Secondary | ICD-10-CM

## 2015-07-25 ENCOUNTER — Encounter: Payer: Self-pay | Admitting: Obstetrics

## 2015-07-25 ENCOUNTER — Ambulatory Visit (INDEPENDENT_AMBULATORY_CARE_PROVIDER_SITE_OTHER): Payer: Medicaid Other | Admitting: Obstetrics

## 2015-07-25 VITALS — BP 113/72 | HR 94 | Wt 139.0 lb

## 2015-07-25 DIAGNOSIS — Z3482 Encounter for supervision of other normal pregnancy, second trimester: Secondary | ICD-10-CM

## 2015-07-25 LAB — POCT URINALYSIS DIPSTICK
Bilirubin, UA: NEGATIVE
Blood, UA: NEGATIVE
Glucose, UA: NEGATIVE
Ketones, UA: NEGATIVE
Leukocytes, UA: NEGATIVE
Nitrite, UA: NEGATIVE
Protein, UA: NEGATIVE
Spec Grav, UA: 1.015
Urobilinogen, UA: NEGATIVE
pH, UA: 6.5

## 2015-07-25 NOTE — Progress Notes (Signed)
Subjective:    Elizabeth [name] is a 25 y.o. female being seen today for her obstetrical visit. She is at [redacted]w[redacted]d gestation. Patient reports: no complaints . Fetal movement: normal.  Problem List Items Addressed This Visit    None    Visit Diagnoses    Encounter for supervision of other normal pregnancy in second trimester    -  Primary    Relevant Orders    POCT urinalysis dipstick (Completed)      There are no active problems to display for this patient.  Objective:    BP 113/72 mmHg  Pulse 94  Wt 139 lb (63.05 kg)  LMP 02/18/2015 FHT: 150 BPM  Uterine Size: size equals dates     Assessment:    Pregnancy @ [redacted]w[redacted]d    Plan:    Signs and symptoms of preterm labor: discussed.  Labs, problem list reviewed and updated 2 hr GTT planned Follow up in 4 weeks.

## 2015-07-25 NOTE — Progress Notes (Signed)
Pt is doing well.

## 2015-08-04 ENCOUNTER — Encounter: Payer: Self-pay | Admitting: Obstetrics

## 2015-08-04 ENCOUNTER — Ambulatory Visit (INDEPENDENT_AMBULATORY_CARE_PROVIDER_SITE_OTHER): Payer: Medicaid Other | Admitting: Obstetrics

## 2015-08-04 VITALS — BP 115/75 | HR 103 | Wt 144.0 lb

## 2015-08-04 DIAGNOSIS — Z3482 Encounter for supervision of other normal pregnancy, second trimester: Secondary | ICD-10-CM

## 2015-08-04 LAB — POCT URINALYSIS DIPSTICK
Bilirubin, UA: NEGATIVE
Glucose, UA: NEGATIVE
Ketones, UA: NEGATIVE
Leukocytes, UA: NEGATIVE
NITRITE UA: NEGATIVE
PH UA: 6
PROTEIN UA: NEGATIVE
RBC UA: NEGATIVE
Spec Grav, UA: 1.01
UROBILINOGEN UA: NEGATIVE

## 2015-08-04 NOTE — Progress Notes (Signed)
Subjective:    Elizabeth [name] is a 25 y.o. female being seen today for her obstetrical visit. She is at [redacted]w[redacted]d gestation. Patient reports: no complaints . Fetal movement: normal.  Problem List Items Addressed This Visit    None    Visit Diagnoses    Encounter for supervision of other normal pregnancy in second trimester    -  Primary    Relevant Orders    POCT urinalysis dipstick      There are no active problems to display for this patient.  Objective:    BP 115/75 mmHg  Pulse 103  Wt 144 lb (65.318 kg)  LMP 02/18/2015 FHT: 150 BPM  Uterine Size: size equals dates     Assessment:    Pregnancy @ [redacted]w[redacted]d    Plan:    OBGCT: ordered for next visit.  Labs, problem list reviewed and updated 2 hr GTT planned Follow up in 4 weeks.

## 2015-08-22 ENCOUNTER — Encounter: Payer: Medicaid Other | Admitting: Obstetrics

## 2015-08-24 ENCOUNTER — Encounter: Payer: Self-pay | Admitting: *Deleted

## 2015-08-24 ENCOUNTER — Other Ambulatory Visit: Payer: Medicaid Other

## 2015-08-24 ENCOUNTER — Ambulatory Visit (INDEPENDENT_AMBULATORY_CARE_PROVIDER_SITE_OTHER): Payer: Medicaid Other | Admitting: Obstetrics

## 2015-08-24 ENCOUNTER — Encounter: Payer: Self-pay | Admitting: Obstetrics

## 2015-08-24 VITALS — BP 124/52 | HR 99 | Wt 146.0 lb

## 2015-08-24 DIAGNOSIS — Z3483 Encounter for supervision of other normal pregnancy, third trimester: Secondary | ICD-10-CM

## 2015-08-24 LAB — POCT URINALYSIS DIPSTICK
Bilirubin, UA: NEGATIVE
Blood, UA: NEGATIVE
Glucose, UA: NEGATIVE
KETONES UA: NEGATIVE
LEUKOCYTES UA: NEGATIVE
Nitrite, UA: NEGATIVE
PROTEIN UA: NEGATIVE
Spec Grav, UA: 1.01
Urobilinogen, UA: NEGATIVE
pH, UA: 7

## 2015-08-24 NOTE — Progress Notes (Signed)
Subjective:    Elizabeth [name] is a 25 y.o. female being seen today for her obstetrical visit. She is at 1387w5d gestation. Patient reports: muscle aches from heavy lifting at work.  Would like to perform light duty only at work with no heavy lifting.  Fetal movement: normal.  Problem List Items Addressed This Visit    None    Visit Diagnoses    Encounter for supervision of other normal pregnancy in second trimester    -  Primary    Relevant Orders    POCT urinalysis dipstick (Completed)    Glucose Tolerance, 2 Hours w/1 Hour    CBC    HIV antibody    RPR      There are no active problems to display for this patient.  Objective:    BP 124/52 mmHg  Pulse 99  Wt 146 lb (66.225 kg)  LMP 02/18/2015 FHT: 150 BPM  Uterine Size: size equals dates     Assessment:    Pregnancy @ 7887w5d     Myalgias from heavy lifting at work.  Plan:   Doctor's note sent to job requesting light duty.   OBGCT: ordered. Signs and symptoms of preterm labor: discussed.  Labs, problem list reviewed and updated 2 hr GTT planned Follow up in 2 weeks.

## 2015-08-25 LAB — GLUCOSE TOLERANCE, 2 HOURS W/ 1HR
GLUCOSE, 1 HOUR: 126 mg/dL (ref 65–179)
GLUCOSE, FASTING: 81 mg/dL (ref 65–91)
Glucose, 2 hour: 93 mg/dL (ref 65–152)

## 2015-08-25 LAB — RPR: RPR Ser Ql: NONREACTIVE

## 2015-08-25 LAB — CBC
Hematocrit: 30.8 % — ABNORMAL LOW (ref 34.0–46.6)
Hemoglobin: 10.3 g/dL — ABNORMAL LOW (ref 11.1–15.9)
MCH: 28.9 pg (ref 26.6–33.0)
MCHC: 33.4 g/dL (ref 31.5–35.7)
MCV: 86 fL (ref 79–97)
PLATELETS: 262 10*3/uL (ref 150–379)
RBC: 3.57 x10E6/uL — AB (ref 3.77–5.28)
RDW: 13.5 % (ref 12.3–15.4)
WBC: 9.6 10*3/uL (ref 3.4–10.8)

## 2015-08-25 LAB — HIV ANTIBODY (ROUTINE TESTING W REFLEX): HIV SCREEN 4TH GENERATION: NONREACTIVE

## 2015-09-04 ENCOUNTER — Other Ambulatory Visit: Payer: Self-pay | Admitting: Certified Nurse Midwife

## 2015-09-07 ENCOUNTER — Encounter: Payer: Self-pay | Admitting: Obstetrics

## 2015-09-07 ENCOUNTER — Ambulatory Visit (INDEPENDENT_AMBULATORY_CARE_PROVIDER_SITE_OTHER): Payer: Medicaid Other | Admitting: Obstetrics

## 2015-09-07 VITALS — BP 105/72 | HR 96 | Temp 98.1°F | Wt 146.0 lb

## 2015-09-07 DIAGNOSIS — Z3483 Encounter for supervision of other normal pregnancy, third trimester: Secondary | ICD-10-CM

## 2015-09-07 LAB — POCT URINALYSIS DIPSTICK
Bilirubin, UA: NEGATIVE
Blood, UA: NEGATIVE
GLUCOSE UA: NEGATIVE
Ketones, UA: NEGATIVE
Nitrite, UA: NEGATIVE
PROTEIN UA: NEGATIVE
Spec Grav, UA: 1.01
UROBILINOGEN UA: NEGATIVE
pH, UA: 8

## 2015-09-07 NOTE — Progress Notes (Signed)
Subjective:    Elizabeth [name] is a 25 y.o. female being seen today for her obstetrical visit. She is at 7031w5d gestation. Patient reports no complaints. Fetal movement: normal.  Problem List Items Addressed This Visit    None    Visit Diagnoses    Encounter for supervision of other normal pregnancy in third trimester    -  Primary    Relevant Orders    POCT urinalysis dipstick (Completed)      There are no active problems to display for this patient.  Objective:    BP 105/72 mmHg  Pulse 96  Temp(Src) 98.1 F (36.7 C)  Wt 146 lb (66.225 kg)  LMP 02/18/2015 FHT:  150 BPM  Uterine Size: size equals dates  Presentation: unsure     Assessment:    Pregnancy @ 1631w5d weeks   Plan:     labs reviewed, problem list updated Consent signed. GBS sent TDAP offered  Rhogam given for RH negative Pediatrician: discussed. Infant feeding: plans to breastfeed. Maternity leave: discussed. Cigarette smoking: former smoker. Orders Placed This Encounter  Procedures  . POCT urinalysis dipstick   No orders of the defined types were placed in this encounter.   Follow up in 2 Weeks.

## 2015-09-07 NOTE — Progress Notes (Signed)
Pt states she is having some cramping.

## 2015-09-21 ENCOUNTER — Encounter: Payer: Self-pay | Admitting: Obstetrics

## 2015-09-21 ENCOUNTER — Ambulatory Visit (INDEPENDENT_AMBULATORY_CARE_PROVIDER_SITE_OTHER): Payer: Medicaid Other | Admitting: Obstetrics

## 2015-09-21 VITALS — BP 121/73 | HR 103 | Wt 150.0 lb

## 2015-09-21 DIAGNOSIS — Z3483 Encounter for supervision of other normal pregnancy, third trimester: Secondary | ICD-10-CM

## 2015-09-21 LAB — POCT URINALYSIS DIPSTICK
BILIRUBIN UA: NEGATIVE
Blood, UA: NEGATIVE
GLUCOSE UA: NEGATIVE
KETONES UA: NEGATIVE
Leukocytes, UA: NEGATIVE
Nitrite, UA: NEGATIVE
Protein, UA: NEGATIVE
Urobilinogen, UA: NEGATIVE
pH, UA: 8

## 2015-09-21 NOTE — Progress Notes (Signed)
Subjective:    Elizabeth [name] is a 25 y.o. female being seen today for her obstetrical visit. She is at 5070w5d gestation. Patient reports no complaints. Fetal movement: normal.  Problem List Items Addressed This Visit    None    Visit Diagnoses    Encounter for supervision of other normal pregnancy in third trimester    -  Primary    Relevant Orders    POCT urinalysis dipstick (Completed)      There are no active problems to display for this patient.  Objective:    BP 121/73 mmHg  Pulse 103  Wt 150 lb (68.04 kg)  LMP 02/18/2015 FHT:  150 BPM  Uterine Size: size equals dates  Presentation: unsure     Assessment:    Pregnancy @ 4570w5d weeks   Plan:     labs reviewed, problem list updated Consent signed. GBS sent TDAP offered  Rhogam given for RH negative Pediatrician: discussed. Infant feeding: plans to breastfeed. Maternity leave: discussed. Cigarette smoking: former smoker. Orders Placed This Encounter  Procedures  . POCT urinalysis dipstick   No orders of the defined types were placed in this encounter.   Follow up in 2 Weeks.

## 2015-09-21 NOTE — Progress Notes (Signed)
Pt has increase in pelvic pressure.  Pt states that she has felt faint/dizzy at times, usually while at work.

## 2015-09-23 ENCOUNTER — Inpatient Hospital Stay (HOSPITAL_COMMUNITY)
Admission: AD | Admit: 2015-09-23 | Discharge: 2015-09-23 | Disposition: A | Discharge status: Home / Self Care | Payer: Medicaid Other | Source: Ambulatory Visit | Attending: Obstetrics | Admitting: Obstetrics

## 2015-09-23 ENCOUNTER — Encounter (HOSPITAL_COMMUNITY): Payer: Self-pay | Admitting: *Deleted

## 2015-09-23 DIAGNOSIS — R102 Pelvic and perineal pain: Secondary | ICD-10-CM | POA: Diagnosis present

## 2015-09-23 DIAGNOSIS — O26713 Subluxation of symphysis (pubis) in pregnancy, third trimester: Secondary | ICD-10-CM | POA: Insufficient documentation

## 2015-09-23 DIAGNOSIS — Z87891 Personal history of nicotine dependence: Secondary | ICD-10-CM | POA: Diagnosis not present

## 2015-09-23 DIAGNOSIS — M791 Myalgia: Secondary | ICD-10-CM | POA: Diagnosis not present

## 2015-09-23 DIAGNOSIS — Z3A31 31 weeks gestation of pregnancy: Secondary | ICD-10-CM | POA: Insufficient documentation

## 2015-09-23 DIAGNOSIS — O9989 Other specified diseases and conditions complicating pregnancy, childbirth and the puerperium: Secondary | ICD-10-CM | POA: Diagnosis not present

## 2015-09-23 DIAGNOSIS — M7918 Myalgia, other site: Secondary | ICD-10-CM

## 2015-09-23 DIAGNOSIS — O99891 Other specified diseases and conditions complicating pregnancy: Secondary | ICD-10-CM

## 2015-09-23 LAB — URINALYSIS, ROUTINE W REFLEX MICROSCOPIC
Bilirubin Urine: NEGATIVE
GLUCOSE, UA: NEGATIVE mg/dL
HGB URINE DIPSTICK: NEGATIVE
Ketones, ur: NEGATIVE mg/dL
Leukocytes, UA: NEGATIVE
Nitrite: NEGATIVE
Protein, ur: NEGATIVE mg/dL
SPECIFIC GRAVITY, URINE: 1.015 (ref 1.005–1.030)
pH: 6 (ref 5.0–8.0)

## 2015-09-23 MED ORDER — TRAMADOL HCL 50 MG PO TABS: 50.0000 mg | ORAL_TABLET | Freq: Four times a day (QID) | ORAL | Status: DC | PRN

## 2015-09-23 NOTE — Progress Notes (Signed)
Transducer adjusted 

## 2015-09-23 NOTE — MAU Provider Note (Signed)
Chief Complaint:  Abdominal Pain   First Provider Initiated Contact with Patient 09/23/15 0402     HPI  Elizabeth [name] is a 25 y.o. G4P1021 at 7031w0dwho presents to maternity admissions reporting severe pelvic pain since Tuesday which worsened yesterday.  Saw Dr Clearance CootsHarper Thursday and was told it was normal, she says.  States it was off and on then, but yesterday became more constant.  Hurts the worst when walking or turning in bed.  Points to pubic bone area..Feels rare painless contractions but this pain is not related to them. She reports good fetal movement, denies LOF, vaginal bleeding, vaginal itching/burning, urinary symptoms, h/a, dizziness, n/v, diarrhea, constipation or fever/chills.  She denies headache, visual changes or RUQ abdominal pain.  RN Note:  Expand All Collapse All   Lower abd pain since Tues. Worse with walking and turning over in bed. Denies bleeding or LOF. Some clear vag d/c           Past Medical History: Past Medical History  Diagnosis Date  . No pertinent past medical history     hx ectopic pregnancy  . Medical history non-contributory     Past obstetric history: OB History  Gravida Para Term Preterm AB SAB TAB Ectopic Multiple Living  4 1 1  0 2 0 1 1 0 1    # Outcome Date GA Lbr Len/2nd Weight Sex Delivery Anes PTL Lv  4 Current           3 Ectopic 2016          2 Term 04/21/12 5524w3d 13:09 / 00:16 2.721 kg (6 lb) F Vag-Spont EPI  Y  1 TAB               Past Surgical History: Past Surgical History  Procedure Laterality Date  . Therapeutic abortion      Family History: Family History  Problem Relation Age of Onset  . Anesthesia problems Neg Hx   . Hearing loss Mother   . Thyroid disease Mother     Social History: Social History  Substance Use Topics  . Smoking status: Former Smoker    Types: Cigarettes    Quit date: 06/02/2014  . Smokeless tobacco: Never Used  . Alcohol Use: No     Comment: socially    Allergies: No Known  Allergies  Meds:  Prescriptions prior to admission  Medication Sig Dispense Refill Last Dose  . Prenatal Vit-Fe Fumarate-FA (PRENATAL VITAMINS PLUS) 27-1 MG TABS Take 1 tablet by mouth 1 day or 1 dose. 30 tablet 3 Taking  . ranitidine (ZANTAC) 150 MG tablet Take 1 tablet (150 mg total) by mouth 2 (two) times daily. 60 tablet 11     I have reviewed patient's Past Medical Hx, Surgical Hx, Family Hx, Social Hx, medications and allergies.   ROS:  Review of Systems  Constitutional: Negative for fever, chills and fatigue.  Cardiovascular: Negative for leg swelling.  Gastrointestinal: Negative for nausea, vomiting, diarrhea and constipation.  Genitourinary: Positive for pelvic pain. Negative for dysuria, flank pain, vaginal bleeding, vaginal discharge (only clear) and vaginal pain.  Musculoskeletal: Negative for myalgias and back pain.  Neurological: Negative for dizziness.   Other systems negative  Physical Exam  Patient Vitals for the past 24 hrs:  BP Temp Pulse Resp Height Weight  09/23/15 0349 111/71 mmHg 98 F (36.7 C) 85 18 5\' 4"  (1.626 m) 68.312 kg (150 lb 9.6 oz)   Constitutional: Well-developed, well-nourished female in no acute distress.  Cardiovascular:  normal rate and rhythm Respiratory: normal effort, clear to auscultation bilaterally GI: Abd soft, non-tender, gravid appropriate for gestational age.   No rebound or guarding. MS: Extremities nontender, no edema, normal ROM Neurologic: Alert and oriented x 4.  GU: Neg CVAT.  PELVIC EXAM: Cervix firm, posterior, neg CMT, uterus nontender, Fundal Height consistent with dates, adnexa without tenderness, enlargement, or mass There is point tenderness at Pubic Symphysis (in center).   Cervix long and closed  FHT:  Baseline 140 , moderate variability, accelerations present, no decelerations Contractions:  Rare   Labs: No results found for this or any previous visit (from the past 24 hour(s)). B/POS/-- (11/23  1350)  Imaging:  No results found.  MAU Course/MDM: I have ordered labs and reviewed results. (UA) NST reviewed  Discussed symphysis pubis dysfunction. Suggest trying pregnancy support belt Avoid extreme abduction of legs Reviewed difference between this, ligament pain, and contractions   Assessment: Symphysis pubis dysfunction  Plan: Discharge home Recommend pregnancy support belt. Preterm Labor precautions and fetal kick counts Follow up in Office for prenatal visits and recheck    Medication List    ASK your doctor about these medications        PRENATAL VITAMINS PLUS 27-1 MG Tabs  Take 1 tablet by mouth 1 day or 1 dose.     ranitidine 150 MG tablet  Commonly known as:  ZANTAC  Take 1 tablet (150 mg total) by mouth 2 (two) times daily.       Pt stable at time of discharge.  Wynelle Bourgeois CNM, MSN Certified Nurse-Midwife 09/23/2015 4:02 AM

## 2015-09-23 NOTE — Discharge Instructions (Signed)
See attached Information on Symphysis Pubis pain (Copied from [facility]) What is Symphysis Pubis Dysfunction (SPD)? SPD describes pain in the symphysis pubis joint at the front of the pelvic girdle. The discomfort is often felt right over the pubic bone at the front, below your tummy, around the sides of your hips or in your lower back. You may experience pain in all or some of the areas shaded in the diagrams below.This leaflet will help you understand more about it, how you can adapt your lifestyle and how you can look after yourself during and after your pregnancy and the labour process. SPD is common. The sooner it is identified and assessed the better it can be managed. About one in five pregnant women experience mild discomfort in the back or especially the front of the pelvis during pregnancy. If you have any symptoms that do not improve within a couple of days or interfere with your normal day-to-day life, you may have SPD and should ask for help from your midwife, GP or physiotherapist. Women experience different symptoms and these are more severe in some women than others. If you understand how SPD may be caused, what treatment is available, and how you can help yourself, this may speed up your recovery, reducing the impact of SPD on your life.  How is SPD diagnosed? The diagnosis of SPD is based on certain signs and symptoms which you may experience during your pregnancy or afterwards. Having one or more of them may indicate the need for a physiotherapy assessment followed by advice on appropriate management. You may also have: ?difficulty with walking ?pain when standing on one leg, e.g. climbing stairs, dressing or getting in and out of the bath ?pain and / or difficulty moving your legs apart, e.g. getting in and out of a car ?clicking or grinding in the pelvic area - you may hear or feel this ?limited or painful hip movements  e.g. turning in bed ?difficulty with lying in some positions, e.g. on your side ?pain during normal activities of daily life ?pain and difficulty during sexual intercourse. Information for patients and visitors With SPD the degree of discomfort you feel may vary from being intermittent and irritating to being very wearing and upsetting. What causes SPD? Sometimes there is no obvious explanation for the cause of SPD. Usually it is a combination of factors, including: ?change in the activity of the muscles in your stomach, pelvis, hip and pelvic floor, which can lead to the pelvic girdle becoming less stable ?a previous fall, accident or weakness that has damaged your pelvis or hips ?hormones released during pregnancy can lead to increased looseness in all ligaments and muscles throughout the body, destabilising joints ?occasionally, the position of the baby may produce symptoms related to SPD. Susceptibility is increased if: ?you have had previous injury to your pelvis ?you have had SPD in a previous pregnancy ?you have a hard physical job or workload ?you have increased body weight and body mass index before and / or by the end of the pregnancy. How many women get SPD? This condition is common; about one in five women will have some pelvic pain. There is a wide range of symptoms and in some women it is worse than in others, but having some symptoms does not mean you are automatically going to get worse. If you get the right treatment early during pregnancy, it can usually be managed well in some cases the symptoms will go completely. However, in  a small percentage of women, SPD may persist longer after birth, particularly if left untreated. Management You will need general advice to help you selfmanage your condition (see overleaf) and you may need one or more of the following referrals:   ?your GP or midwife can refer you to the Physiotherapy Department for assessment of your pelvic  joints, followed by treatment (as necessary) and advice on how to manage your condition ?your GP for medication for pain relief Physiotherapy treatment Physiotherapy aims to improve your spinal and pelvic joint position and stability, relieve pain and improve muscle function. Treatment may include: ?manual therapy to ensure your spinal, pelvic and hip joints are moving correctly ?exercises to stretch out tighter tissue and to help strengthen and improve stability of your stomach, back, pelvis and hips ?advice including: back care, lifting advice, suggested positions for labour and birth, looking after your baby or other toddlers, positions for sexual intercourse ?other types of pain relief, e.g. TENS, ice / heat exercises in water ?provision of equipment such as pelvic support belts, crutches or wheelchairs. Your physiotherapist will see you during your pregnancy, as necessary. You may need several visits to control your pain and Information for patients and visitors improve your stability. If the pain persists treatment can continue after you have had your baby. Exercise during pregnancy ?Take moderate exercise, but do not start new sporting activities ?Don't indulge in intensive or extensive periods of exercise ?Avoid high impact exercise such as running, racket sports and aerobics ?Swimming may be of benefit, but avoid breast stroke and leg kicks ?Walk with shorter strides than usual ?Generally avoid any activity which increases your pelvic girdle pain Tips during pregnancy ?Be as active as possible within pain limits   ?Avoid activities that make the pain worse ?Ask for and accept help with household chores and involve your partner, family and friends ?Rest when you can - you may need to rest and sit down more often ?Sit down to get dressed and undressed ?Avoid standing on one leg ?Wear flat, supportive shoes ?Avoid standing to do tasks such as ironing ?Try to keep  your knees together when moving in and out of the car - a plastic bag on the seat may help you swivel ?Sleep in a comfortable position, e.g. Lie on your side with a pillow between your legs ?Try different ways of turning in bed, e.g. turning under or over with your knees together and squeeze your buttocks ?Roll in and out of bed keeping your knees together ?Take the stairs one at a time (go upstairs leading with your less painful leg and downstairs with the more painful one, or go upstairs backwards, or on your bottom) ?Plan your day - bring everything you need downstairs in the morning and have everything to hand ?Consider alternative positions if you desire sexual intercourse, e.g. lying on your side or kneeling on all fours Activities to avoid which will make the pain worse ?Standing on one leg ?Bending and twisting to lift or carry a toddler or baby on one hip ?Crossing your legs ?Sitting on the floor ?Sitting or standing for long periods ?Lifting heavy weights (shopping bags, wet washing, vacuum cleaners, toddlers) ?Vacuuming ?Pushing heavy objects like supermarket trolleys or pushchairs, especially uphill ?Carrying anything in only one hand After you have had your baby You should move about as much as possible after the baby, within the limits of your pain.  Be aware, medication to relieve pain may cover up the discomfort of your  SPD, so be Information for patients and visitors careful and continue to avoid the aggravating activities, as you did before you had your baby.  Most womens SPD symptoms disappear in the week following the birth. If you still have symptoms 10-14 days after the birth, you should be referred to a physiotherapist for assessment and receive treatment from your midwife or GP. Remember, SPD is common and treatable. The sooner it is identified and assessed, the better it can be managed.    Contact details for Further Information Physiotherapy  Department, [facility], [place]: [phone]  [email or web address] Date of issue: June, 2015 Review Period: June, 2018 Author: Physiotherapy Department [id number]  NLGFT 2015

## 2015-09-23 NOTE — MAU Note (Signed)
Lower abd pain since Tues. Worse with walking and turning over in bed. Denies bleeding or LOF. Some clear vag d/c

## 2015-09-23 NOTE — Progress Notes (Signed)
Elizabeth BourgeoisMarie [name] CNM in to discuss test result and d/c plan. Written and verbal d/c instructions given and understanding voiced

## 2015-10-05 ENCOUNTER — Encounter: Payer: Medicaid Other | Admitting: Obstetrics

## 2015-10-09 ENCOUNTER — Ambulatory Visit (INDEPENDENT_AMBULATORY_CARE_PROVIDER_SITE_OTHER): Payer: Medicaid Other | Admitting: Obstetrics

## 2015-10-09 VITALS — BP 109/70 | HR 101 | Temp 98.7°F | Wt 152.0 lb

## 2015-10-09 DIAGNOSIS — Z3493 Encounter for supervision of normal pregnancy, unspecified, third trimester: Secondary | ICD-10-CM

## 2015-10-09 LAB — POCT URINALYSIS DIPSTICK
BILIRUBIN UA: NEGATIVE
Blood, UA: NEGATIVE
GLUCOSE UA: 50
KETONES UA: NEGATIVE
Nitrite, UA: NEGATIVE
Protein, UA: NEGATIVE
Urobilinogen, UA: NEGATIVE
pH, UA: 7

## 2015-10-09 NOTE — Progress Notes (Signed)
Patient had to leave. Phone call from her daughter's teacher.

## 2015-10-21 NOTE — Progress Notes (Signed)
Cancelled appointment

## 2015-10-23 ENCOUNTER — Ambulatory Visit (INDEPENDENT_AMBULATORY_CARE_PROVIDER_SITE_OTHER): Payer: Medicaid Other | Admitting: Obstetrics

## 2015-10-23 VITALS — BP 114/66 | HR 100 | Temp 98.7°F | Wt 157.0 lb

## 2015-10-23 DIAGNOSIS — Z3493 Encounter for supervision of normal pregnancy, unspecified, third trimester: Secondary | ICD-10-CM

## 2015-10-23 LAB — POCT URINALYSIS DIPSTICK
BILIRUBIN UA: NEGATIVE
Blood, UA: NEGATIVE
Glucose, UA: NEGATIVE
KETONES UA: NEGATIVE
Nitrite, UA: NEGATIVE
PROTEIN UA: NEGATIVE
Spec Grav, UA: 1.01
Urobilinogen, UA: NEGATIVE
pH, UA: 7

## 2015-10-24 ENCOUNTER — Encounter: Payer: Self-pay | Admitting: Obstetrics

## 2015-10-24 NOTE — Progress Notes (Signed)
Subjective:    Elizabeth [name] is a 25 y.o. female being seen today for her obstetrical visit. She is at 4736w3d gestation. Patient reports no complaints. Fetal movement: normal.  Problem List Items Addressed This Visit    None    Visit Diagnoses    Prenatal care in second trimester    -  Primary    Relevant Orders    POCT Urinalysis Dipstick (Completed)    Strep Gp B NAA      There are no active problems to display for this patient.  Objective:    BP 114/66 mmHg  Pulse 100  Temp(Src) 98.7 F (37.1 C)  Wt 157 lb (71.215 kg)  LMP 02/18/2015 FHT:  150 BPM  Uterine Size: size equals dates  Presentation: unsure     Assessment:    Pregnancy @ 3136w3d weeks   Plan:     labs reviewed, problem list updated Consent signed. GBS sent TDAP offered  Rhogam given for RH negative Pediatrician: discussed. Infant feeding: plans to breastfeed. Maternity leave: discussed. Cigarette smoking: former smoker. Orders Placed This Encounter  Procedures  . Strep Gp B NAA  . POCT Urinalysis Dipstick   No orders of the defined types were placed in this encounter.   Follow up in 1 Week.

## 2015-10-25 LAB — STREP GP B NAA: Strep Gp B NAA: NEGATIVE

## 2015-10-30 ENCOUNTER — Ambulatory Visit (INDEPENDENT_AMBULATORY_CARE_PROVIDER_SITE_OTHER): Payer: Medicaid Other | Admitting: Obstetrics

## 2015-10-30 VITALS — BP 103/69 | HR 95 | Wt 156.0 lb

## 2015-10-30 DIAGNOSIS — Z3493 Encounter for supervision of normal pregnancy, unspecified, third trimester: Secondary | ICD-10-CM

## 2015-10-30 LAB — POCT URINALYSIS DIPSTICK
BILIRUBIN UA: NEGATIVE
GLUCOSE UA: NEGATIVE
KETONES UA: NEGATIVE
Nitrite, UA: NEGATIVE
RBC UA: NEGATIVE
Spec Grav, UA: 1.01
Urobilinogen, UA: NEGATIVE
pH, UA: 7

## 2015-10-31 ENCOUNTER — Encounter: Payer: Self-pay | Admitting: Obstetrics

## 2015-10-31 NOTE — Progress Notes (Signed)
Subjective:    Elizabeth [name] is a 25 y.o. female being seen today for her obstetrical visit. She is at 8769w3d gestation. Patient reports no complaints. Fetal movement: normal.  Problem List Items Addressed This Visit    None    Visit Diagnoses    Prenatal care in third trimester    -  Primary    Relevant Orders    POCT Urinalysis Dipstick (Completed)      There are no active problems to display for this patient.  Objective:    BP 103/69 mmHg  Pulse 95  Wt 156 lb (70.761 kg)  LMP 02/18/2015 FHT:  150 BPM  Uterine Size: size equals dates  Presentation: unsure     Assessment:    Pregnancy @ 2069w3d weeks   Plan:     labs reviewed, problem list updated Consent signed. GBS sent TDAP offered  Rhogam given for RH negative Pediatrician: discussed. Infant feeding: plans to breastfeed. Maternity leave: discussed. Cigarette smoking: former. Orders Placed This Encounter  Procedures  . POCT Urinalysis Dipstick   No orders of the defined types were placed in this encounter.   Follow up in 1 Week.

## 2015-11-08 ENCOUNTER — Inpatient Hospital Stay (HOSPITAL_COMMUNITY)
Admission: AD | Admit: 2015-11-08 | Discharge: 2015-11-08 | Disposition: A | Discharge status: Home / Self Care | Payer: Medicaid Other | Source: Ambulatory Visit | Attending: Obstetrics | Admitting: Obstetrics

## 2015-11-08 ENCOUNTER — Encounter (HOSPITAL_COMMUNITY): Payer: Self-pay | Admitting: *Deleted

## 2015-11-08 DIAGNOSIS — Z87891 Personal history of nicotine dependence: Secondary | ICD-10-CM | POA: Diagnosis not present

## 2015-11-08 DIAGNOSIS — R55 Syncope and collapse: Secondary | ICD-10-CM | POA: Diagnosis not present

## 2015-11-08 DIAGNOSIS — Z3A37 37 weeks gestation of pregnancy: Secondary | ICD-10-CM | POA: Insufficient documentation

## 2015-11-08 DIAGNOSIS — O26893 Other specified pregnancy related conditions, third trimester: Secondary | ICD-10-CM | POA: Insufficient documentation

## 2015-11-08 LAB — COMPREHENSIVE METABOLIC PANEL
ALT: 8 U/L — ABNORMAL LOW (ref 14–54)
AST: 14 U/L — ABNORMAL LOW (ref 15–41)
Albumin: 2.8 g/dL — ABNORMAL LOW (ref 3.5–5.0)
Alkaline Phosphatase: 135 U/L — ABNORMAL HIGH (ref 38–126)
Anion gap: 9 (ref 5–15)
BUN: 7 mg/dL (ref 6–20)
CO2: 21 mmol/L — ABNORMAL LOW (ref 22–32)
Calcium: 8.9 mg/dL (ref 8.9–10.3)
Chloride: 106 mmol/L (ref 101–111)
Creatinine, Ser: 0.62 mg/dL (ref 0.44–1.00)
GFR calc Af Amer: >60 mL/min (ref >60)
GFR calc non Af Amer: >60 mL/min (ref >60)
Glucose, Bld: 103 mg/dL — ABNORMAL HIGH (ref 65–99)
Potassium: 4.2 mmol/L (ref 3.5–5.1)
Sodium: 136 mmol/L (ref 135–145)
Total Bilirubin: 0.9 mg/dL (ref 0.3–1.2)
Total Protein: 6.2 g/dL — ABNORMAL LOW (ref 6.5–8.1)

## 2015-11-08 LAB — URINALYSIS, ROUTINE W REFLEX MICROSCOPIC
Bilirubin Urine: NEGATIVE
Glucose, UA: NEGATIVE mg/dL
Hgb urine dipstick: NEGATIVE
KETONES UR: NEGATIVE mg/dL
NITRITE: NEGATIVE
PROTEIN: NEGATIVE mg/dL
Specific Gravity, Urine: 1.015 (ref 1.005–1.030)
pH: 6.5 (ref 5.0–8.0)

## 2015-11-08 LAB — CBC
HCT: 27.9 % — ABNORMAL LOW (ref 36.0–46.0)
Hemoglobin: 9.1 g/dL — ABNORMAL LOW (ref 12.0–15.0)
MCH: 26.6 pg (ref 26.0–34.0)
MCHC: 32.6 g/dL (ref 30.0–36.0)
MCV: 81.6 fL (ref 78.0–100.0)
Platelets: 227 10*3/uL (ref 150–400)
RBC: 3.42 MIL/uL — ABNORMAL LOW (ref 3.87–5.11)
RDW: 13.3 % (ref 11.5–15.5)
WBC: 10 10*3/uL (ref 4.0–10.5)

## 2015-11-08 LAB — URINE MICROSCOPIC-ADD ON: RBC / HPF: NONE SEEN RBC/hpf (ref 0–5)

## 2015-11-08 NOTE — MAU Note (Signed)
Patient arrived by EMS for near syncope.  Pt was sitting in hot car for with no air.  States this happens when overheated.  No lof, no vaginal bleeding.  Feeling baby move well.

## 2015-11-08 NOTE — Discharge Instructions (Signed)
Syncope °Syncope means a person passes out (faints). The person usually wakes up in less than 5 minutes. It is important to seek medical care for syncope. °HOME CARE °· Have someone stay with you until you feel normal. °· Do not drive, use machines, or play sports until your doctor says it is okay. °· Keep all doctor visits as told. °· Lie down when you feel like you might pass out. Take deep breaths. Wait until you feel normal before standing up. °· Drink enough fluids to keep your pee (urine) clear or pale yellow. °· If you take blood pressure or heart medicine, get up slowly. Take several minutes to sit and then stand. °GET HELP RIGHT AWAY IF:  °· You have a severe headache. °· You have pain in the chest, belly (abdomen), or back. °· You are bleeding from the mouth or butt (rectum). °· You have black or tarry poop (stool). °· You have an irregular or very fast heartbeat. °· You have pain with breathing. °· You keep passing out, or you have shaking (seizures) when you pass out. °· You pass out when sitting or lying down. °· You feel confused. °· You have trouble walking. °· You have severe weakness. °· You have vision problems. °If you fainted, call for help (911 in [place] Do not drive yourself to the hospital. °  °This information is not intended to replace advice given to you by your health care provider. Make sure you discuss any questions you have with your health care provider. °  °Document Released: 11/13/2007 Document Revised: 10/11/2014 Document Reviewed: 07/26/2011 °Elsevier Interactive Patient Education ©2016 [employer] ° °

## 2015-11-08 NOTE — MAU Provider Note (Signed)
History     CSN: [id number]  Arrival date and time: 11/08/15 1323   First Provider Initiated Contact with Patient 11/08/15 1414      Chief Complaint  Patient presents with  . Near Syncope   HPI  Elizabeth [name] 25 y.o. [id number] @ [redacted]w[redacted]d presents to the MAU after passing out in the heat. She states she did not fall down or hit her abdomen. She denies LOF, vaginal bleeding, contractions, dizziness or lightheadedness  Past Medical History  Diagnosis Date  . No pertinent past medical history     hx ectopic pregnancy  . Medical history non-contributory     Past Surgical History  Procedure Laterality Date  . Therapeutic abortion      Family History  Problem Relation Age of Onset  . Anesthesia problems Neg Hx   . Hearing loss Mother   . Thyroid disease Mother     Social History  Substance Use Topics  . Smoking status: Former Smoker    Types: Cigarettes    Quit date: 06/02/2014  . Smokeless tobacco: Never Used  . Alcohol Use: No     Comment: socially    Allergies: No Known Allergies  Prescriptions prior to admission  Medication Sig Dispense Refill Last Dose  . acetaminophen (TYLENOL) 325 MG tablet Take 650 mg by mouth every 6 (six) hours as needed for moderate pain.   Past Month at Unknown time  . Prenatal Vit-Fe Fumarate-FA (PRENATAL VITAMINS PLUS) 27-1 MG TABS Take 1 tablet by mouth 1 day or 1 dose. 30 tablet 3 11/07/2015 at Unknown time  . ranitidine (ZANTAC) 150 MG tablet TAKE 1 TABLET (150 MG TOTAL) BY MOUTH 2 (TWO) TIMES DAILY.  11 11/07/2015 at Unknown time    Review of Systems  Constitutional: Negative for fever.  All other systems reviewed and are negative.  Physical Exam   Blood pressure 124/74, pulse 100, temperature 98.3 F (36.8 C), temperature source Oral, resp. rate 12, height  (1.626 m), last menstrual period 02/18/2015, SpO2 100 %, unknown if currently breastfeeding.  Physical Exam  Nursing note and vitals reviewed. Constitutional: She is  oriented to person, place, and time. She appears well-developed and well-nourished. No distress.  HENT:  Head: Normocephalic and atraumatic.  Neck: Normal range of motion.  Cardiovascular: Normal rate and regular rhythm.   Respiratory: Effort normal and breath sounds normal. No respiratory distress.  GI: Soft. She exhibits no distension.  Musculoskeletal: Normal range of motion.  Neurological: She is alert and oriented to person, place, and time.  Skin: Skin is warm and dry.  Psychiatric: She has a normal mood and affect. Her behavior is normal. Judgment and thought content normal.   Results for orders placed or performed during the hospital encounter of 11/08/15 (from the past 24 hour(s))  Urinalysis, Routine w reflex microscopic (not at [facility])     Status: Abnormal   Collection Time: 11/08/15  1:25 PM  Result Value Ref Range   Color, Urine YELLOW YELLOW   APPearance CLEAR CLEAR   Specific Gravity, Urine 1.015 1.005 - 1.030   pH 6.5 5.0 - 8.0   Glucose, UA NEGATIVE NEGATIVE mg/dL   Hgb urine dipstick NEGATIVE NEGATIVE   Bilirubin Urine NEGATIVE NEGATIVE   Ketones, ur NEGATIVE NEGATIVE mg/dL   Protein, ur NEGATIVE NEGATIVE mg/dL   Nitrite NEGATIVE NEGATIVE   Leukocytes, UA MODERATE (A) NEGATIVE  Urine microscopic-add on     Status: Abnormal   Collection Time: 11/08/15  1:25 PM  Result  Value Ref Range   Squamous Epithelial / LPF 0-5 (A) NONE SEEN   WBC, UA 6-30 0 - 5 WBC/hpf   RBC / HPF NONE SEEN 0 - 5 RBC/hpf   Bacteria, UA FEW (A) NONE SEEN  CBC     Status: Abnormal   Collection Time: 11/08/15  2:22 PM  Result Value Ref Range   WBC 10.0 4.0 - 10.5 K/uL   RBC 3.42 (L) 3.87 - 5.11 MIL/uL   Hemoglobin 9.1 (L) 12.0 - 15.0 g/dL   HCT 04.527.9 (L) 40.936.0 - 81.146.0 %   MCV 81.6 78.0 - 100.0 fL   MCH 26.6 26.0 - 34.0 pg   MCHC 32.6 30.0 - 36.0 g/dL   RDW 91.413.3 78.211.5 - 95.615.5 %   Platelets 227 150 - 400 K/uL  Comprehensive metabolic panel     Status: Abnormal   Collection Time: 11/08/15  2:22  PM  Result Value Ref Range   Sodium 136 135 - 145 mmol/L   Potassium 4.2 3.5 - 5.1 mmol/L   Chloride 106 101 - 111 mmol/L   CO2 21 (L) 22 - 32 mmol/L   Glucose, Bld 103 (H) 65 - 99 mg/dL   BUN 7 6 - 20 mg/dL   Creatinine, Ser 2.130.62 0.44 - 1.00 mg/dL   Calcium 8.9 8.9 - 08.610.3 mg/dL   Total Protein 6.2 (L) 6.5 - 8.1 g/dL   Albumin 2.8 (L) 3.5 - 5.0 g/dL   AST 14 (L) 15 - 41 U/L   ALT 8 (L) 14 - 54 U/L   Alkaline Phosphatase 135 (H) 38 - 126 U/L   Total Bilirubin 0.9 0.3 - 1.2 mg/dL   GFR calc non Af Amer >60 >60 mL/min   GFR calc Af Amer >60 >60 mL/min   Anion gap 9 5 - 15   MAU Course  Procedures  MDM Pt was brought in by EMS and was able to walk and answer questions on arrival. She feels as though she may have passed out because of the heat. Her labs are unremarkable and she will be discharged to home. fHR- reactive, no contractions  Assessment and Plan  Syncope  Discharge  Clemmons,Lori Grissett 11/08/2015, 3:30 PM

## 2015-11-09 ENCOUNTER — Encounter: Payer: Medicaid Other | Admitting: Obstetrics

## 2015-11-13 ENCOUNTER — Encounter: Payer: Medicaid Other | Admitting: Obstetrics

## 2015-11-15 ENCOUNTER — Other Ambulatory Visit: Payer: Self-pay | Admitting: Obstetrics and Gynecology

## 2015-11-20 ENCOUNTER — Encounter: Payer: Self-pay | Admitting: Obstetrics

## 2015-11-20 ENCOUNTER — Ambulatory Visit (INDEPENDENT_AMBULATORY_CARE_PROVIDER_SITE_OTHER): Payer: Medicaid Other | Admitting: Obstetrics

## 2015-11-20 VITALS — BP 117/70 | HR 101 | Temp 98.4°F | Wt 165.0 lb

## 2015-11-20 DIAGNOSIS — Z3493 Encounter for supervision of normal pregnancy, unspecified, third trimester: Secondary | ICD-10-CM

## 2015-11-20 LAB — POCT URINALYSIS DIPSTICK
Bilirubin, UA: NEGATIVE
CLARITY UA: NEGATIVE
COLOR UA: NEGATIVE
GLUCOSE UA: NEGATIVE
Ketones, UA: NEGATIVE
NITRITE UA: NEGATIVE
RBC UA: NEGATIVE
Spec Grav, UA: <=1.005
UROBILINOGEN UA: NEGATIVE
pH, UA: 7

## 2015-11-20 NOTE — Progress Notes (Signed)
Subjective:    Elizabeth [name] is a 25 y.o. female being seen today for her obstetrical visit. She is at 5230w2d gestation. Patient reports occasional contractions and pelvic pressure. Fetal movement: normal.  Problem List Items Addressed This Visit    None    Visit Diagnoses    Pregnancy, obstetrical care, third trimester    -  Primary    Relevant Orders    POCT Urinalysis Dipstick (Completed)      There are no active problems to display for this patient.   Objective:    BP 117/70 mmHg  Pulse 101  Temp(Src) 98.4 F (36.9 C)  Wt 165 lb (74.844 kg)  LMP 02/18/2015 FHT: 150 BPM  Uterine Size: size equals dates  Presentations: cephalic  Pelvic Exam:              Dilation: 1cm       Effacement: 75%             Station:  -1    Consistency: soft            Position: posterior     Assessment:    Pregnancy @ 7030w2d weeks   Plan:   Plans for delivery: Vaginal anticipated; labs reviewed; problem list updated Counseling: Consent signed. Infant feeding: plans to breastfeed. Cigarette smoking: former smoker. L&D discussion: symptoms of labor, discussed when to call, discussed what number to call, anesthetic/analgesic options reviewed and delivering clinician:  plans no preference. Postpartum supports and preparation: circumcision discussed and contraception plans discussed.  Follow up in 1 Week.

## 2015-11-20 NOTE — Progress Notes (Signed)
Patient states that she has been having swelling in her feet and hands.  Patient complains of pain in pelvic area.  Patient states that she had a "clear liquid" that came out of her on saturday

## 2015-11-21 ENCOUNTER — Encounter (HOSPITAL_COMMUNITY): Payer: Self-pay | Admitting: *Deleted

## 2015-11-21 ENCOUNTER — Inpatient Hospital Stay (HOSPITAL_COMMUNITY)
Admission: AD | Admit: 2015-11-21 | Discharge: 2015-11-23 | DRG: 775 | Disposition: A | Discharge status: Home / Self Care | Payer: Medicaid Other | Source: Ambulatory Visit | Attending: Obstetrics | Admitting: Obstetrics

## 2015-11-21 DIAGNOSIS — Z3A39 39 weeks gestation of pregnancy: Secondary | ICD-10-CM

## 2015-11-21 LAB — RPR: RPR: NONREACTIVE

## 2015-11-21 LAB — POCT FERN TEST: POCT Fern Test: POSITIVE

## 2015-11-21 LAB — TYPE AND SCREEN
ABO/RH(D): B POS
Antibody Screen: NEGATIVE

## 2015-11-21 LAB — CBC
HCT: 30.1 % — ABNORMAL LOW (ref 36.0–46.0)
Hemoglobin: 9.9 g/dL — ABNORMAL LOW (ref 12.0–15.0)
MCH: 26.1 pg (ref 26.0–34.0)
MCHC: 32.9 g/dL (ref 30.0–36.0)
MCV: 79.4 fL (ref 78.0–100.0)
PLATELETS: 228 10*3/uL (ref 150–400)
RBC: 3.79 MIL/uL — AB (ref 3.87–5.11)
RDW: 13.9 % (ref 11.5–15.5)
WBC: 10.6 10*3/uL — ABNORMAL HIGH (ref 4.0–10.5)

## 2015-11-21 MED ORDER — ONDANSETRON HCL 4 MG/2ML IJ SOLN: 4.0000 mg | INTRAMUSCULAR | Status: DC | PRN

## 2015-11-21 MED ORDER — BUTORPHANOL TARTRATE 1 MG/ML IJ SOLN
INTRAMUSCULAR | Status: AC
  Filled 2015-11-21: qty 1

## 2015-11-21 MED ORDER — SIMETHICONE 80 MG PO CHEW: 80.0000 mg | CHEWABLE_TABLET | ORAL | Status: DC | PRN

## 2015-11-21 MED ORDER — DIBUCAINE 1 % RE OINT: 1.0000 "application " | TOPICAL_OINTMENT | RECTAL | Status: DC | PRN

## 2015-11-21 MED ORDER — ACETAMINOPHEN 325 MG PO TABS
650.0000 mg | ORAL_TABLET | ORAL | Status: DC | PRN
  Administered 2015-11-21: 650 mg via ORAL
  Filled 2015-11-21: qty 2

## 2015-11-21 MED ORDER — FLEET ENEMA 7-19 GM/118ML RE ENEM: 1.0000 | ENEMA | RECTAL | Status: DC | PRN

## 2015-11-21 MED ORDER — WITCH HAZEL-GLYCERIN EX PADS: 1.0000 "application " | MEDICATED_PAD | CUTANEOUS | Status: DC | PRN

## 2015-11-21 MED ORDER — OXYTOCIN 40 UNITS IN LACTATED RINGERS INFUSION - SIMPLE MED
2.5000 [IU]/h | INTRAVENOUS | Status: DC
  Filled 2015-11-21: qty 1000

## 2015-11-21 MED ORDER — LACTATED RINGERS IV SOLN: 500.0000 mL | INTRAVENOUS | Status: DC | PRN

## 2015-11-21 MED ORDER — ONDANSETRON HCL 4 MG PO TABS: 4.0000 mg | ORAL_TABLET | ORAL | Status: DC | PRN

## 2015-11-21 MED ORDER — OXYCODONE-ACETAMINOPHEN 5-325 MG PO TABS: 2.0000 | ORAL_TABLET | ORAL | Status: DC | PRN

## 2015-11-21 MED ORDER — OXYCODONE-ACETAMINOPHEN 5-325 MG PO TABS
1.0000 | ORAL_TABLET | ORAL | Status: DC | PRN
  Administered 2015-11-21 – 2015-11-22 (×4): 2 via ORAL
  Administered 2015-11-22 (×4): 1 via ORAL
  Administered 2015-11-23 (×2): 2 via ORAL
  Filled 2015-11-21: qty 1
  Filled 2015-11-21 (×3): qty 2
  Filled 2015-11-21: qty 1
  Filled 2015-11-21 (×2): qty 2
  Filled 2015-11-21 (×2): qty 1
  Filled 2015-11-21: qty 2

## 2015-11-21 MED ORDER — BUTORPHANOL TARTRATE 1 MG/ML IJ SOLN: 1.0000 mg | INTRAMUSCULAR | Status: DC | PRN

## 2015-11-21 MED ORDER — ONDANSETRON HCL 4 MG/2ML IJ SOLN: 4.0000 mg | Freq: Four times a day (QID) | INTRAMUSCULAR | Status: DC | PRN

## 2015-11-21 MED ORDER — ZOLPIDEM TARTRATE 5 MG PO TABS: 5.0000 mg | ORAL_TABLET | Freq: Every evening | ORAL | Status: DC | PRN

## 2015-11-21 MED ORDER — IBUPROFEN 600 MG PO TABS
600.0000 mg | ORAL_TABLET | Freq: Four times a day (QID) | ORAL | Status: DC
  Administered 2015-11-21 – 2015-11-23 (×10): 600 mg via ORAL
  Filled 2015-11-21 (×9): qty 1

## 2015-11-21 MED ORDER — ACETAMINOPHEN 325 MG PO TABS: 650.0000 mg | ORAL_TABLET | ORAL | Status: DC | PRN

## 2015-11-21 MED ORDER — COCONUT OIL OIL: 1.0000 "application " | TOPICAL_OIL | Status: DC | PRN

## 2015-11-21 MED ORDER — OXYTOCIN BOLUS FROM INFUSION
500.0000 mL | INTRAVENOUS | Status: DC
  Administered 2015-11-21: 500 mL via INTRAVENOUS

## 2015-11-21 MED ORDER — SENNOSIDES-DOCUSATE SODIUM 8.6-50 MG PO TABS
2.0000 | ORAL_TABLET | ORAL | Status: DC
  Administered 2015-11-21 – 2015-11-22 (×2): 2 via ORAL
  Filled 2015-11-21 (×2): qty 2

## 2015-11-21 MED ORDER — LIDOCAINE HCL (PF) 1 % IJ SOLN
30.0000 mL | INTRAMUSCULAR | Status: DC | PRN
  Filled 2015-11-21: qty 30

## 2015-11-21 MED ORDER — LACTATED RINGERS IV SOLN
INTRAVENOUS | Status: DC
  Administered 2015-11-21: 02:00:00 via INTRAVENOUS

## 2015-11-21 MED ORDER — OXYCODONE-ACETAMINOPHEN 5-325 MG PO TABS
1.0000 | ORAL_TABLET | ORAL | Status: DC | PRN
Start: 2015-11-21 — End: 2015-11-21

## 2015-11-21 MED ORDER — PRENATAL MULTIVITAMIN CH
1.0000 | ORAL_TABLET | Freq: Every day | ORAL | Status: DC
  Administered 2015-11-21 – 2015-11-23 (×3): 1 via ORAL
  Filled 2015-11-21 (×2): qty 1

## 2015-11-21 MED ORDER — BENZOCAINE-MENTHOL 20-0.5 % EX AERO: 1.0000 "application " | INHALATION_SPRAY | CUTANEOUS | Status: DC | PRN

## 2015-11-21 MED ORDER — TETANUS-DIPHTH-ACELL PERTUSSIS 5-2.5-18.5 LF-MCG/0.5 IM SUSP: 0.5000 mL | Freq: Once | INTRAMUSCULAR | Status: DC

## 2015-11-21 MED ORDER — SOD CITRATE-CITRIC ACID 500-334 MG/5ML PO SOLN: 30.0000 mL | ORAL | Status: DC | PRN

## 2015-11-21 MED ORDER — DIPHENHYDRAMINE HCL 25 MG PO CAPS: 25.0000 mg | ORAL_CAPSULE | Freq: Four times a day (QID) | ORAL | Status: DC | PRN

## 2015-11-21 MED ORDER — FERROUS SULFATE 325 (65 FE) MG PO TABS
325.0000 mg | ORAL_TABLET | Freq: Two times a day (BID) | ORAL | Status: DC
  Administered 2015-11-21 – 2015-11-23 (×5): 325 mg via ORAL
  Filled 2015-11-21 (×5): qty 1

## 2015-11-21 NOTE — Progress Notes (Signed)
NT came to RN and reported that while NT was in patient's room doing hearing screen on baby, patient told FOB "don't forget to bring my pain medication from home when you go." NT then stated that FOB responded, "you mean your tylenol?" and patient nodded her head yes. Upon going into patient's room for assessment immediately after being informed of this, RN informed patient that outside medications were not allowed to be taken in the hospital and this was very important in order for the patient's medications to be monitored. Informed patient that any medication that she takes that we don't know about could have some kind of reaction to medication we are giving her and then we would not know what caused any issues with her. Informed patient that tylenol was ordered and that we can give her tylenol. Also informed patient that percocet is ordered, which has tylenol in it, which cannot be given along with tylenol because tylenol doses must be monitored and limited. Explained to patient why this was important and explained that percocet was stronger than tylenol. Patient stated she would take the stronger medication because her pain is increasing again. Patient was also previously given Kpad this morning to assist with her cramping. Patient states this helps somewhat to an extent. Will continue to monitor. Earl Galasborne, Linda HedgesStefanie [place]

## 2015-11-21 NOTE — H&P (Signed)
This is Dr. Francoise CeoBernard Marshall dictating the history and physical on  Elizabeth [name]  she's a 25 year old gravida 4 para 1021 EDC 11/25/2015 negative GBS admitted to labor and delivery fully dilated her membranes ruptured spontaneously at 12:45 AM fluid clear she progressed rapidly had a normal vaginal delivery of a female Apgar 8 and 9 the tight nuchal cord the placenta was spontaneous intact and him there was no episiotomy or laceration Past medical history negative Past surgical history negative Social history negative System review negative Physical exam well-developed female post delivery HEENT negative Lungs clear to P&A Heart regular rhythm no murmurs no gallops Breasts negative Abdomen uterus 20 week postpartum size Extremities negative

## 2015-11-21 NOTE — Progress Notes (Signed)
Post Partum Day #0 Subjective: up ad lib, tolerating PO and reports pain not being controlled by Motrin, desires percocet.  Orders placed.   Objective: Blood pressure 133/77, pulse 80, temperature 98.5 F (36.9 C), temperature source Oral, resp. rate 16, height 5\' 4"  (1.626 m), weight 165 lb (74.844 kg), last menstrual period 02/18/2015, SpO2 100 %, unknown if currently breastfeeding.  Physical Exam:  General: alert, cooperative and no distress Lochia: appropriate Uterine Fundus: firm Incision: none DVT Evaluation: No evidence of DVT seen on physical exam. No cords or calf tenderness. No significant calf/ankle edema.   Recent Labs  11/21/15 0200  HGB 9.9*  HCT 30.1*    Assessment/Plan: Continue current care.    LOS: 0 days   Elizabeth [name], CNM 11/21/2015, 8:02 AM

## 2015-11-22 LAB — CBC
HCT: 28.3 % — ABNORMAL LOW (ref 36.0–46.0)
Hemoglobin: 9.2 g/dL — ABNORMAL LOW (ref 12.0–15.0)
MCH: 26.4 pg (ref 26.0–34.0)
MCHC: 32.5 g/dL (ref 30.0–36.0)
MCV: 81.3 fL (ref 78.0–100.0)
PLATELETS: 227 10*3/uL (ref 150–400)
RBC: 3.48 MIL/uL — AB (ref 3.87–5.11)
RDW: 14.1 % (ref 11.5–15.5)
WBC: 10.7 10*3/uL — AB (ref 4.0–10.5)

## 2015-11-22 NOTE — Progress Notes (Signed)
Post Partum Day #1 Subjective: no complaints, up ad lib, voiding and tolerating PO  Objective: Blood pressure 119/65, pulse 68, temperature 98.2 F (36.8 C), temperature source Oral, resp. rate 18, height 5\' 4"  (1.626 m), weight 165 lb (74.844 kg), last menstrual period 02/18/2015, SpO2 100 %, unknown if currently breastfeeding.  Physical Exam:  General: alert, cooperative and no distress Lochia: appropriate Uterine Fundus: firm Incision: no significant drainage DVT Evaluation: No evidence of DVT seen on physical exam. No cords or calf tenderness. No significant calf/ankle edema.   Recent Labs  11/21/15 0200 11/22/15 0535  HGB 9.9* 9.2*  HCT 30.1* 28.3*    Assessment/Plan: Plan for discharge tomorrow   LOS: 1 day   Roe CoombsRachelle A Zachrey Deutscher, CNM 11/22/2015, 8:18 AM

## 2015-11-23 MED ORDER — IBUPROFEN 600 MG PO TABS: 600.0000 mg | ORAL_TABLET | Freq: Four times a day (QID) | ORAL | Status: DC

## 2015-11-23 MED ORDER — POLYSACCHARIDE IRON COMPLEX 150 MG PO CAPS: 150.0000 mg | ORAL_CAPSULE | Freq: Every day | ORAL | Status: DC

## 2015-11-23 MED ORDER — SENNOSIDES-DOCUSATE SODIUM 8.6-50 MG PO TABS: 2.0000 | ORAL_TABLET | Freq: Two times a day (BID) | ORAL | Status: DC

## 2015-11-23 MED ORDER — OXYCODONE-ACETAMINOPHEN 5-325 MG PO TABS: 1.0000 | ORAL_TABLET | ORAL | Status: DC | PRN

## 2015-11-23 NOTE — Discharge Summary (Signed)
Obstetric Discharge Summary Reason for Admission: onset of labor Prenatal Procedures: ultrasound Intrapartum Procedures: spontaneous vaginal delivery Postpartum Procedures: none Complications-Operative and Postpartum: none HEMOGLOBIN  Date Value Ref Range Status  11/22/2015 9.2* 12.0 - 15.0 g/dL Final   HCT  Date Value Ref Range Status  11/22/2015 28.3* 36.0 - 46.0 % Final   HEMATOCRIT  Date Value Ref Range Status  08/24/2015 30.8* 34.0 - 46.6 % Final    Physical Exam:  General: alert, cooperative and no distress Lochia: appropriate Uterine Fundus: firm Incision: none DVT Evaluation: No evidence of DVT seen on physical exam. No cords or calf tenderness. No significant calf/ankle edema.  Discharge Diagnoses: Term Pregnancy-delivered  Discharge Information: Date: 11/23/2015 Activity: pelvic rest Diet: routine Medications: PNV, Ibuprofen, Colace, Iron and Percocet Condition: stable Instructions: refer to practice specific booklet Discharge to: home Follow-up Information    [facility] weeks.   Why:  postpartum exam   Contact information:   [address] [place] [id number] [phone]      Newborn Data: Live born female  Birth Weight: 6 lb 15.6 oz (3165 g) APGAR: 8, 9  Home with mother.  Roe Coombsachelle A Ottie Neglia, CNM 11/23/2015, 8:12 AM

## 2015-11-28 ENCOUNTER — Encounter: Payer: Medicaid Other | Admitting: Obstetrics

## 2015-12-26 ENCOUNTER — Encounter: Payer: Self-pay | Admitting: Obstetrics

## 2015-12-26 ENCOUNTER — Ambulatory Visit (INDEPENDENT_AMBULATORY_CARE_PROVIDER_SITE_OTHER): Payer: Medicaid Other | Admitting: Obstetrics

## 2015-12-26 DIAGNOSIS — Z30013 Encounter for initial prescription of injectable contraceptive: Secondary | ICD-10-CM

## 2015-12-26 MED ORDER — MEDROXYPROGESTERONE ACETATE 150 MG/ML IM SUSP: 150.0000 mg | INTRAMUSCULAR | Status: DC

## 2015-12-26 NOTE — Progress Notes (Signed)
Subjective:     Elizabeth [name] is a 25 y.o. female who presents for a postpartum visit. She is 6 weeks postpartum following a spontaneous vaginal delivery. I have fully reviewed the prenatal and intrapartum course. The delivery was at 39 gestational weeks. Outcome: spontaneous vaginal delivery. Anesthesia: none. Postpartum course has been normal. Baby's course has been normal. Baby is feeding by bottle - Similac Advance. Bleeding thin lochia. Bowel function is normal. Bladder function is normal. Patient is not sexually active. Contraception method is abstinence. Postpartum depression screening: negative.  Tobacco, alcohol and substance abuse history reviewed.  Adult immunizations reviewed including TDAP, rubella and varicella.  The following portions of the patient's history were reviewed and updated as appropriate: allergies, current medications, past family history, past medical history, past social history, past surgical history and problem list.  Review of Systems A comprehensive review of systems was negative.   Objective:    BP 116/77 mmHg  Pulse 71  Temp(Src) 98.6 F (37 C)  Wt 157 lb (71.215 kg)  LMP 12/26/2015     Assessment:     Normal postpartum exam. Pap smear not done at today's visit.     Contraceptive counseling and advice.  Wants Depo Provera  Plan:    1. Contraception: Depo-Provera injections 2. Depo Provera Rx 3. Follow up in: 6 weeks or as needed.   Healthy lifestyle practices reviewed

## 2016-01-23 ENCOUNTER — Ambulatory Visit: Payer: Medicaid Other | Admitting: Obstetrics

## 2016-03-22 IMAGING — US US OB TRANSVAGINAL
1 series · 13 of 28 positions shown · non-contrast
Comparison: Pelvic ultrasound performed 01/28/2014

CLINICAL DATA: Acute onset of pelvic pain for 1 week. Vaginal
discharge. Initial encounter.

EXAM:
OBSTETRIC <14 WK US AND TRANSVAGINAL OB US
TECHNIQUE: Both transabdominal and transvaginal ultrasound examinations were
performed for complete evaluation of the gestation as well as the
maternal uterus, adnexal regions, and pelvic cul-de-sac.
Transvaginal technique was performed to assess early pregnancy.

[Series 1: us ob comp less 14 wks · 13 of 32 slices shown]
[im 2/32]
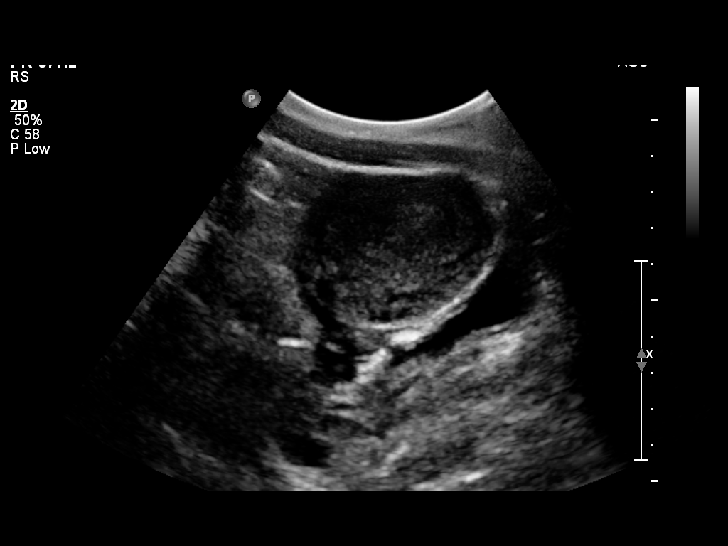
[im 4/32]
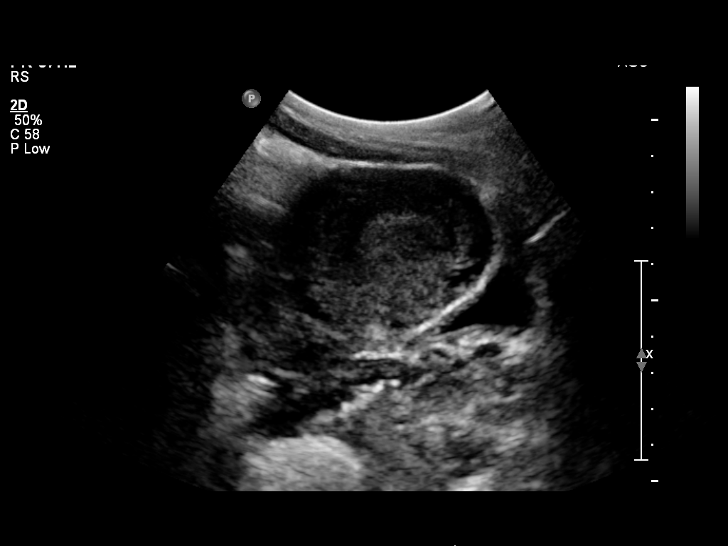
[im 6/32]
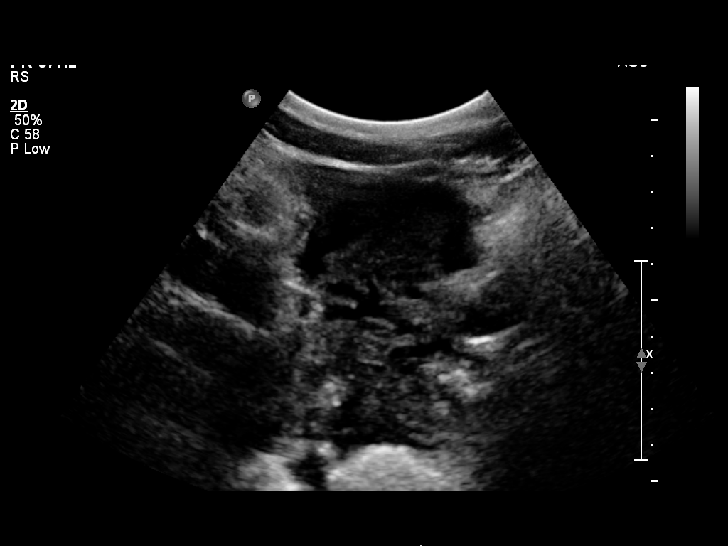
[im 9/32]
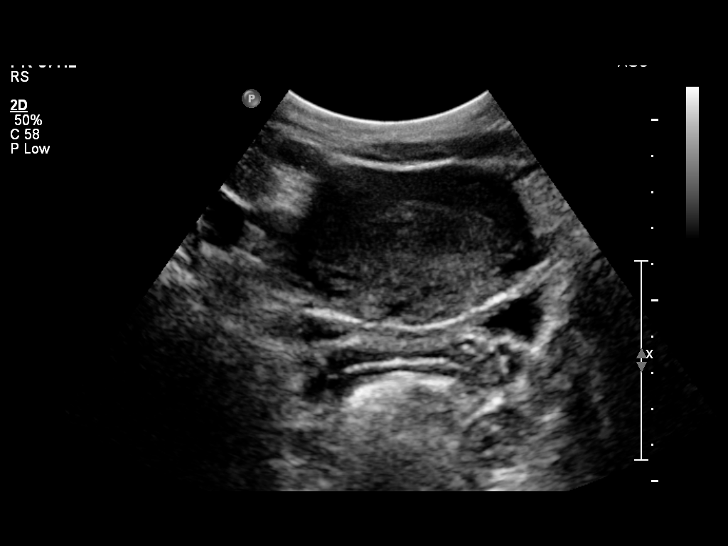
[im 11/32]
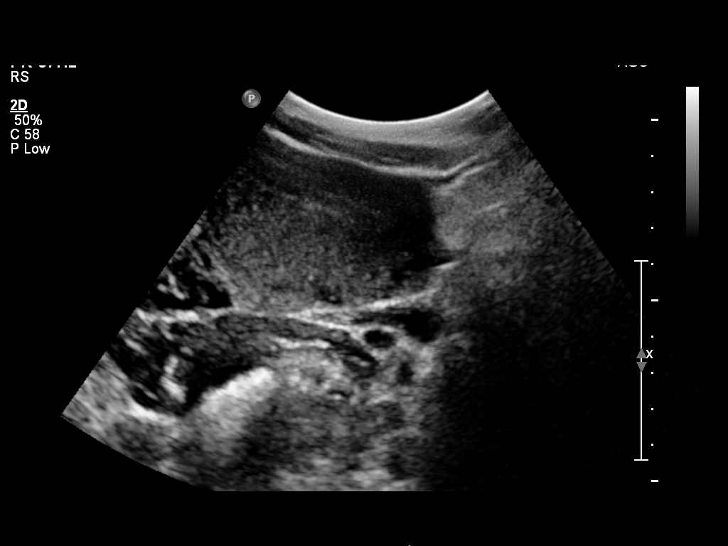
[im 13/32]
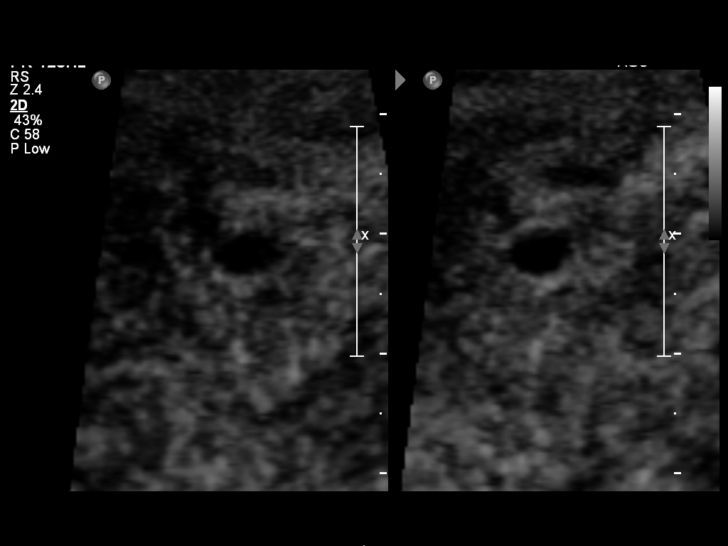
[im 17/32]
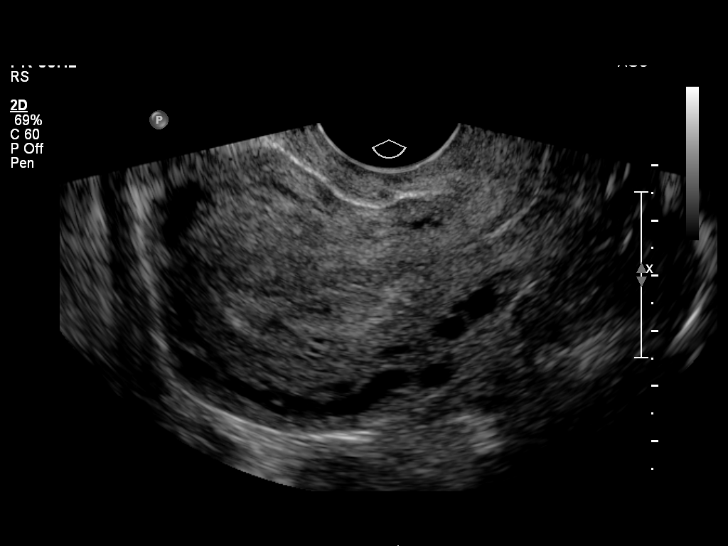
[im 19/32]
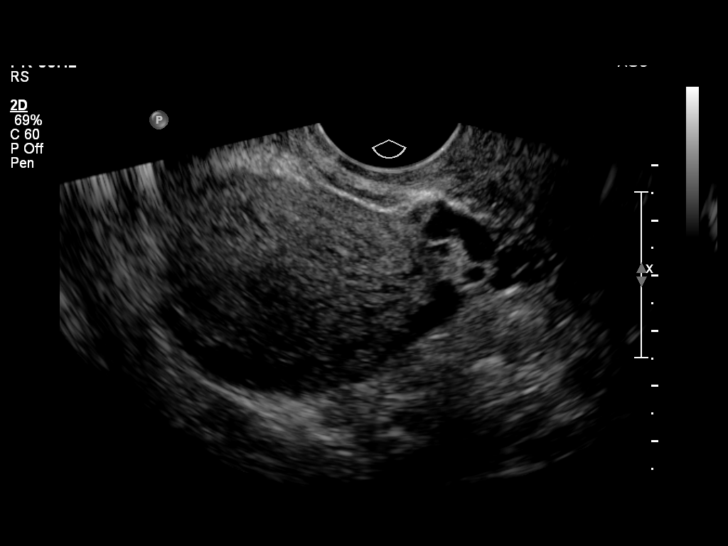
[im 21/32]
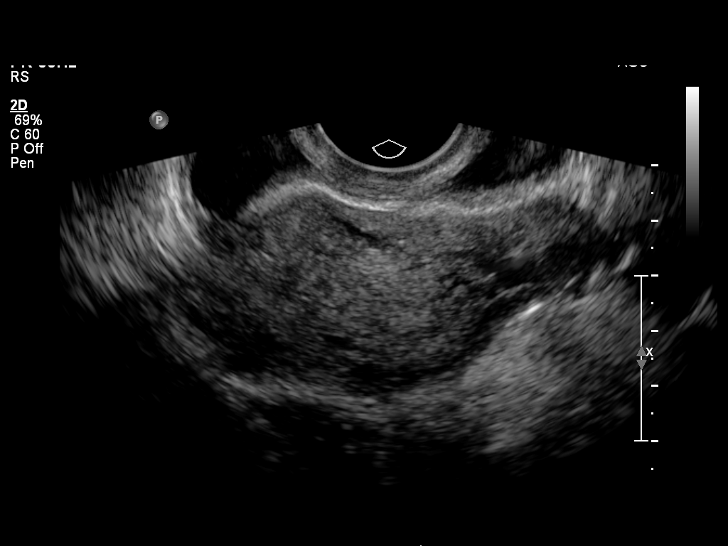
[im 23/32]
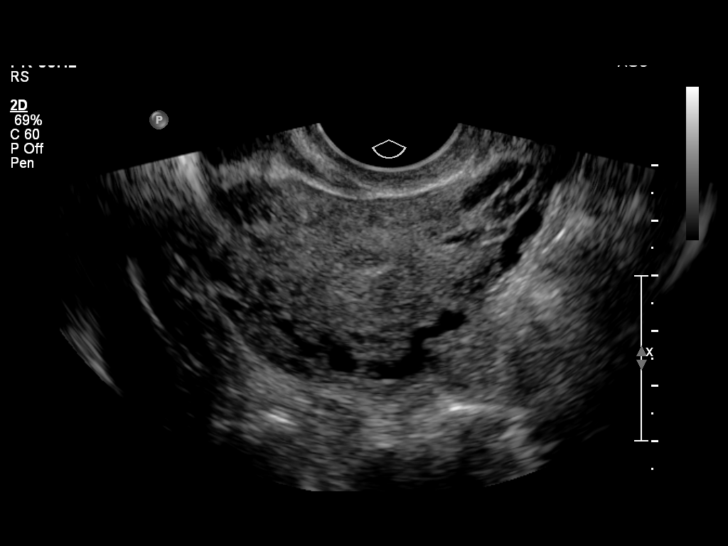
[im 26/32]
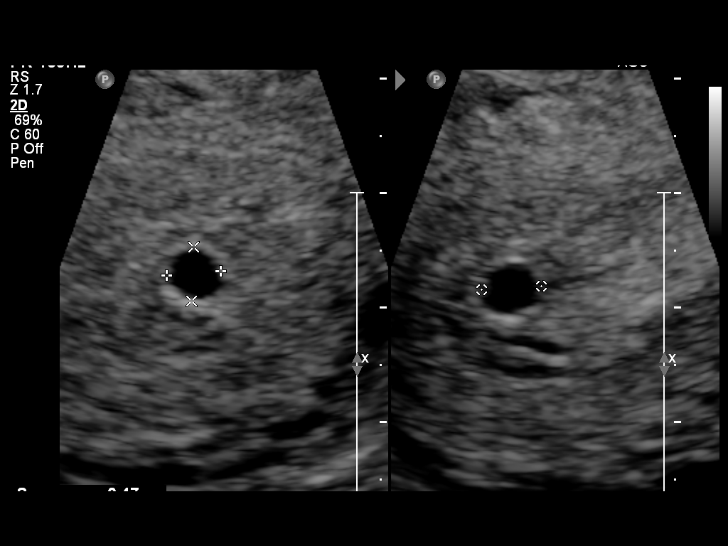
[im 28/32]
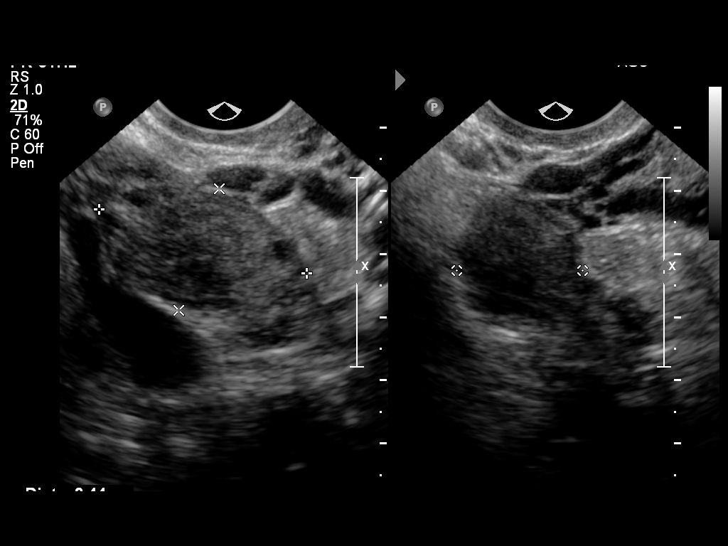
[im 30/32]
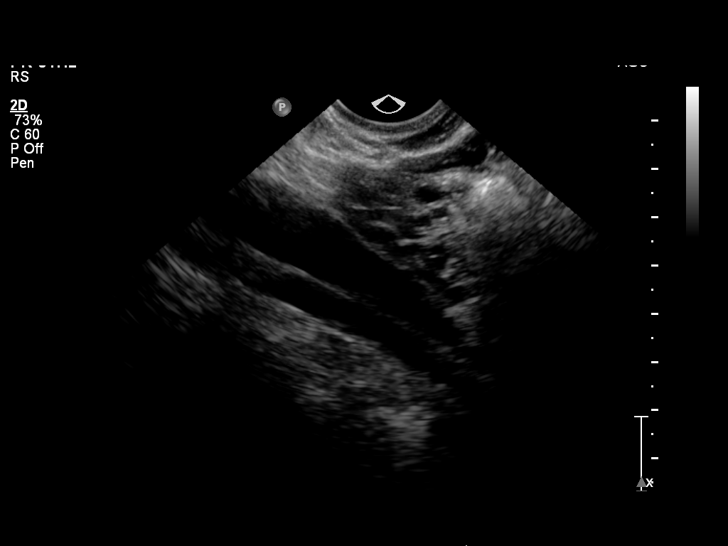

[13 of 28 positions shown; findings below may reference images not displayed]

FINDINGS: Intrauterine gestational sac: Visualized/normal in shape.

Yolk sac:  No

Embryo:  No

Cardiac Activity: N/A

MSD: 4.9 mm   5 w   0  d

Maternal uterus/adnexae: No subchorionic hemorrhage is noted. The
uterus is otherwise unremarkable.

The right ovary is unremarkable in appearance, measuring 3.4 x 2.0 x
2.3 cm. The left ovary is not visualized on this study. No
suspicious adnexal masses are seen.

Trace free fluid is seen within the pelvic cul-de-sac.
IMPRESSION: Single intrauterine gestational sac noted, with a mean sac diameter
of 5 mm, corresponding to a gestational age of 5 weeks 0 days. This
matches the gestational age of 5 weeks 2 days by LMP, reflecting an
estimated date of delivery [REDACTED], 0688. The yolk sac and
embryo are not yet seen, within normal limits given the size of the
gestational sac.

## 2016-03-28 ENCOUNTER — Ambulatory Visit: Payer: Self-pay

## 2016-03-29 ENCOUNTER — Encounter: Payer: Self-pay | Admitting: *Deleted

## 2016-03-29 ENCOUNTER — Telehealth: Payer: Self-pay | Admitting: *Deleted

## 2016-03-29 NOTE — Telephone Encounter (Signed)
error 

## 2016-04-01 ENCOUNTER — Other Ambulatory Visit: Payer: Self-pay

## 2016-04-01 ENCOUNTER — Other Ambulatory Visit (INDEPENDENT_AMBULATORY_CARE_PROVIDER_SITE_OTHER): Payer: Self-pay

## 2016-04-01 VITALS — BP 120/76 | HR 84 | Temp 98.6°F | Wt 165.0 lb

## 2016-04-01 DIAGNOSIS — Z308 Encounter for other contraceptive management: Secondary | ICD-10-CM

## 2016-04-01 DIAGNOSIS — Z3042 Encounter for surveillance of injectable contraceptive: Secondary | ICD-10-CM

## 2016-04-01 DIAGNOSIS — Z3201 Encounter for pregnancy test, result positive: Secondary | ICD-10-CM

## 2016-04-01 LAB — POCT URINE PREGNANCY: PREG TEST UR: NEGATIVE

## 2016-04-01 NOTE — Progress Notes (Signed)
Patient in office today for Depo Restart, UPT was negative, Patient to RTO to office on 04/15/2016 for Depo injection and patient advised no intercourse for two weeks and to bring Depo with her on next visit, If UPT negative at that time as well. Patient verbalized understanding.

## 2016-04-15 ENCOUNTER — Ambulatory Visit: Payer: Self-pay | Admitting: Obstetrics

## 2016-04-15 ENCOUNTER — Other Ambulatory Visit: Payer: Self-pay

## 2016-06-23 ENCOUNTER — Emergency Department (HOSPITAL_COMMUNITY)
Admission: EM | Admit: 2016-06-23 | Discharge: 2016-06-23 | Disposition: A | Discharge status: Home / Self Care | Payer: Medicaid Other | Attending: Emergency Medicine | Admitting: Emergency Medicine

## 2016-06-23 DIAGNOSIS — Z79899 Other long term (current) drug therapy: Secondary | ICD-10-CM | POA: Diagnosis not present

## 2016-06-23 DIAGNOSIS — Z87891 Personal history of nicotine dependence: Secondary | ICD-10-CM | POA: Insufficient documentation

## 2016-06-23 DIAGNOSIS — Z20828 Contact with and (suspected) exposure to other viral communicable diseases: Secondary | ICD-10-CM

## 2016-06-23 MED ORDER — OSELTAMIVIR PHOSPHATE 75 MG PO CAPS: 75.0000 mg | ORAL_CAPSULE | Freq: Two times a day (BID) | ORAL | 0 refills | Status: DC

## 2016-06-23 NOTE — ED Provider Notes (Signed)
[facility]-EMERGENCY DEPT Provider Note   CSN: [id number] Arrival date & time: 06/23/16  1254  By signing my name below, I, Elizabeth [name], attest that this documentation has been prepared under the direction and in the presence of Elizabeth MaskerKaren Sofia, PA-C Electronically Signed: Soijett [name], ED Scribe. 06/23/16. 3:12 PM.  History   Chief Complaint Chief Complaint  Patient presents with  . Request Flu Test    HPI Elizabeth [name] is a 26 y.o. female who presents to the Emergency Department complaining of requesting a flu test onset today. Pt notes that she was informed by her employer at [employer] senior care that her patient was dx with flu and that she would need to be tested for "asian flu" and treated with Tamiflu. Pt notes that she hasn't receives a flu vaccination. She hasn't tried any medications for the relief of her symptoms. She denies fever, chills, cough, generalized body aches, sore throat, trouble swallowing, rash, wound, and any other symptoms.    The history is provided by the patient. No language interpreter was used.    Past Medical History:  Diagnosis Date  . Medical history non-contributory   . No pertinent past medical history    hx ectopic pregnancy    Patient Active Problem List   Diagnosis Date Noted  . Normal labor 11/21/2015  . NVD (normal vaginal delivery) 11/21/2015    Past Surgical History:  Procedure Laterality Date  . THERAPEUTIC ABORTION      OB History    Gravida Para Term Preterm AB Living   4 2 2  0 2 2   SAB TAB Ectopic Multiple Live Births   0 1 1 0 2       Home Medications    Prior to Admission medications   Medication Sig Start Date End Date Taking? Authorizing Provider  ibuprofen (ADVIL,MOTRIN) 600 MG tablet Take 1 tablet (600 mg total) by mouth 4 (four) times daily. Patient not taking: Reported on 04/01/2016 11/23/15   Rachelle A Denney, CNM  iron polysaccharides (NIFEREX) 150 MG capsule Take 1 capsule (150 mg total) by mouth daily. Patient  not taking: Reported on 04/01/2016 11/23/15   Roe Coombsachelle A Denney, CNM  medroxyPROGESTERone (DEPO-PROVERA) 150 MG/ML injection Inject 1 mL (150 mg total) into the muscle every 3 (three) months. 12/26/15   Brock Badharles A Harper, MD  oxyCODONE-acetaminophen (PERCOCET/ROXICET) 5-325 MG tablet Take 1-2 tablets by mouth every 4 (four) hours as needed for moderate pain or severe pain. Patient not taking: Reported on 04/01/2016 11/23/15   Rachelle A Denney, CNM  senna-docusate (SENOKOT-S) 8.6-50 MG tablet Take 2 tablets by mouth 2 (two) times daily. Patient not taking: Reported on 04/01/2016 11/23/15   Roe Coombsachelle A Denney, CNM    Family History Family History  Problem Relation Age of Onset  . Anesthesia problems Neg Hx   . Hearing loss Mother   . Thyroid disease Mother     Social History Social History  Substance Use Topics  . Smoking status: Former Smoker    Types: Cigarettes    Quit date: 06/02/2014  . Smokeless tobacco: Never Used  . Alcohol use No     Comment: socially     Allergies   Patient has no known allergies.   Review of Systems Review of Systems  Constitutional: Negative for chills and fever.  HENT: Negative for sore throat and trouble swallowing.   Respiratory: Negative for cough.   Musculoskeletal: Negative for myalgias.    Physical Exam Updated Vital Signs BP 95/83 (BP  Location: Left Arm)   Pulse 92   Temp 98.6 F (37 C) (Oral)   Resp 16   SpO2 100%   Physical Exam  Constitutional: She is oriented to person, place, and time. She appears well-developed and well-nourished. No distress.  HENT:  Head: Normocephalic and atraumatic.  Eyes: EOM are normal.  Neck: Neck supple.  Cardiovascular: Normal rate, regular rhythm and normal heart sounds.  Exam reveals no gallop and no friction rub.   No murmur heard. Pulmonary/Chest: Effort normal and breath sounds normal. No respiratory distress. She has no wheezes. She has no rales.  Abdominal: She exhibits no distension.    Musculoskeletal: Normal range of motion.  Neurological: She is alert and oriented to person, place, and time.  Skin: Skin is warm and dry.  Psychiatric: She has a normal mood and affect. Her behavior is normal.  Nursing note and vitals reviewed.  ED Treatments / Results  DIAGNOSTIC STUDIES: Oxygen Saturation is 100% on RA, nl by my interpretation.    COORDINATION OF CARE: 3:07 PM Discussed treatment plan with pt at bedside which includes tamiflu Rx and pt agreed to plan.  Procedures Procedures (including critical care time)  Medications Ordered in ED Medications - No data to display   Initial Impression / Assessment and Plan / ED Course  I have reviewed the triage vital signs and the nursing notes.   Clinical Course       Final Clinical Impressions(s) / ED Diagnoses   Final diagnoses:  Exposure to the flu    New Prescriptions Discharge Medication List as of 06/23/2016  3:26 PM      I personally performed the services in this documentation, which was scribed in my presence.  The recorded information has been reviewed and considered.   Elizabeth [name].   Elizabeth Areas, PA-C 06/23/16 1826    Elizabeth Areas, PA-C 06/23/16 1904    Elizabeth Guise, MD 06/24/16 [phone]

## 2016-06-23 NOTE — ED Notes (Signed)
Patient was alert, oriented and stable upon discharge. RN went over AVS and patient had no further questions.  

## 2016-06-23 NOTE — ED Triage Notes (Signed)
Pt sent for influenza testing because several coworkers were diagnosed with influenza.

## 2016-06-23 NOTE — Discharge Instructions (Signed)
Start tamiflu if you have symptoms of influenza

## 2016-07-01 ENCOUNTER — Ambulatory Visit: Payer: Medicaid Other

## 2016-09-28 ENCOUNTER — Encounter (HOSPITAL_COMMUNITY): Payer: Self-pay

## 2016-09-28 ENCOUNTER — Emergency Department (HOSPITAL_COMMUNITY)
Admission: EM | Admit: 2016-09-28 | Discharge: 2016-09-28 | Disposition: A | Discharge status: Home / Self Care | Payer: Medicaid Other | Attending: Emergency Medicine | Admitting: Emergency Medicine

## 2016-09-28 ENCOUNTER — Emergency Department (HOSPITAL_COMMUNITY): Payer: Medicaid Other

## 2016-09-28 DIAGNOSIS — R51 Headache: Secondary | ICD-10-CM | POA: Diagnosis not present

## 2016-09-28 DIAGNOSIS — Z87891 Personal history of nicotine dependence: Secondary | ICD-10-CM | POA: Diagnosis not present

## 2016-09-28 DIAGNOSIS — R6889 Other general symptoms and signs: Secondary | ICD-10-CM

## 2016-09-28 DIAGNOSIS — R509 Fever, unspecified: Secondary | ICD-10-CM | POA: Diagnosis present

## 2016-09-28 DIAGNOSIS — Z79899 Other long term (current) drug therapy: Secondary | ICD-10-CM | POA: Insufficient documentation

## 2016-09-28 DIAGNOSIS — R05 Cough: Secondary | ICD-10-CM | POA: Insufficient documentation

## 2016-09-28 DIAGNOSIS — R112 Nausea with vomiting, unspecified: Secondary | ICD-10-CM | POA: Insufficient documentation

## 2016-09-28 LAB — INFLUENZA PANEL BY PCR (TYPE A & B)
INFLAPCR: NEGATIVE
Influenza B By PCR: NEGATIVE

## 2016-09-28 LAB — RAPID STREP SCREEN (MED CTR MEBANE ONLY): STREPTOCOCCUS, GROUP A SCREEN (DIRECT): NEGATIVE

## 2016-09-28 MED ORDER — ACETAMINOPHEN 325 MG PO TABS
650.0000 mg | ORAL_TABLET | Freq: Once | ORAL | Status: AC
  Administered 2016-09-28: 650 mg via ORAL

## 2016-09-28 MED ORDER — OSELTAMIVIR PHOSPHATE 75 MG PO CAPS: 75.0000 mg | ORAL_CAPSULE | Freq: Two times a day (BID) | ORAL | 0 refills | Status: AC

## 2016-09-28 MED ORDER — ACETAMINOPHEN 325 MG PO TABS
ORAL_TABLET | ORAL | Status: AC
  Administered 2016-09-28: 650 mg via ORAL
  Filled 2016-09-28: qty 2

## 2016-09-28 NOTE — ED Notes (Signed)
Pt given ginger ale per Jessica(RN) 

## 2016-09-28 NOTE — ED Triage Notes (Signed)
Patient here with 2 days of cold symptoms, runny nose, frontal headache and chills. No cough, NAD. Alert and oriented.

## 2016-09-28 NOTE — ED Notes (Signed)
Pt given graham crackers per Tyler(PA)

## 2016-09-28 NOTE — Discharge Instructions (Signed)
Please read and follow all provided instructions.  Your diagnoses today include:  1. Flu-like symptoms    Tests performed today include: Vital signs. See below for your results today.   Medications prescribed:   Take any prescribed medications only as directed.  Home care instructions:  Follow any educational materials contained in this packet. Please continue drinking plenty of fluids. Use over-the-counter cold and flu medications as needed as directed on packaging for symptom relief. You may also use ibuprofen or tylenol as directed on packaging for pain or fever.   BE VERY CAREFUL not to take multiple medicines containing Tylenol (also called acetaminophen). Doing so can lead to an overdose which can damage your liver and cause liver failure and possibly death.   Follow-up instructions: Please follow-up with your primary care provider in the next 3 days for further evaluation of your symptoms.   Return instructions:  Please return to the Emergency Department if you experience worsening symptoms. Please return if you have a high fever greater than 101 degrees not controlled with over-the-counter medications, persistent vomiting and cannot keep down fluids, or worsening trouble breathing. Please return if you have any other emergent concerns.  Additional Information:  Your vital signs today were: BP 107/66 (BP Location: Right Arm)    Pulse (!) 110    Temp 98.9 F (37.2 C) (Oral)    Resp 18    SpO2 99%  If your blood pressure (BP) was elevated above 135/85 this visit, please have this repeated by your doctor within one month.

## 2016-09-28 NOTE — ED Provider Notes (Signed)
[facility]-EMERGENCY DEPT Provider Note   CSN: [id number] Arrival date & time: 09/28/16  1735   By signing my name below, I, Teofilo Pod, attest that this documentation has been prepared under the direction and in the presence of Audry Pili, PA-C. Electronically Signed: Teofilo Pod, ED Scribe. 09/28/2016. 6:24 PM.   History   Chief Complaint Chief Complaint  Patient presents with  . cold symptoms/chills   The history is provided by the patient. No language interpreter was used.   HPI Comments:  Elizabeth [name] is a 26 y.o. female who presents to the Emergency Department complaining of a constant fever x 2 days. Pt complains of associated nausea, vomiting, chills, rhinorrhea, headache, productive cough. Pt has a temp of 102.9 at the ED. No alleviating factors noted. No meds PTA. No pain currently. Denies diarrhea. No CP/SOB. No other symptoms noted.   Past Medical History:  Diagnosis Date  . Medical history non-contributory   . No pertinent past medical history    hx ectopic pregnancy    Patient Active Problem List   Diagnosis Date Noted  . Normal labor 11/21/2015  . NVD (normal vaginal delivery) 11/21/2015    Past Surgical History:  Procedure Laterality Date  . THERAPEUTIC ABORTION      OB History    Gravida Para Term Preterm AB Living   0 2 2   SAB TAB Ectopic Multiple Live Births   0 1 1 0 2       Home Medications    Prior to Admission medications   Medication Sig Start Date End Date Taking? Authorizing Provider  ibuprofen (ADVIL,MOTRIN) 600 MG tablet Take 1 tablet (600 mg total) by mouth 4 (four) times daily. Patient not taking: Reported on 04/01/2016 11/23/15   Rachelle A Denney, CNM  iron polysaccharides (NIFEREX) 150 MG capsule Take 1 capsule (150 mg total) by mouth daily. Patient not taking: Reported on 04/01/2016 11/23/15   Roe Coombs, CNM  medroxyPROGESTERone (DEPO-PROVERA) 150 MG/ML injection Inject 1 mL (150 mg total) into the muscle  every 3 (three) months. 12/26/15   Brock Bad, MD  oseltamivir (TAMIFLU) 75 MG capsule Take 1 capsule (75 mg total) by mouth every 12 (twelve) hours. 06/23/16   Elson Areas, PA-C  oxyCODONE-acetaminophen (PERCOCET/ROXICET) 5-325 MG tablet Take 1-2 tablets by mouth every 4 (four) hours as needed for moderate pain or severe pain. Patient not taking: Reported on 04/01/2016 11/23/15   Rachelle A Denney, CNM  senna-docusate (SENOKOT-S) 8.6-50 MG tablet Take 2 tablets by mouth 2 (two) times daily. Patient not taking: Reported on 04/01/2016 11/23/15   Roe Coombs, CNM    Family History Family History  Problem Relation Age of Onset  . Hearing loss Mother   . Thyroid disease Mother   . Anesthesia problems Neg Hx     Social History Social History  Substance Use Topics  . Smoking status: Former Smoker    Types: Cigarettes    Quit date: 06/02/2014  . Smokeless tobacco: Never Used  . Alcohol use No     Comment: socially     Allergies   Patient has no known allergies.   Review of Systems Review of Systems  Constitutional: Positive for chills and fever.  HENT: Positive for rhinorrhea.   Respiratory: Positive for cough.   Gastrointestinal: Positive for nausea and vomiting. Negative for diarrhea.  Neurological: Positive for headaches.   Physical Exam Updated Vital Signs BP 117/68   Pulse 98  Temp (!) 102.9 F (39.4 C) (Oral)   Resp 18   SpO2 100%   Physical Exam  Constitutional: She is oriented to person, place, and time. She appears well-developed and well-nourished. No distress.  HENT:  Head: Normocephalic and atraumatic.  Right Ear: Tympanic membrane, external ear and ear canal normal.  Left Ear: Tympanic membrane, external ear and ear canal normal.  Nose: Nose normal.  Mouth/Throat: Uvula is midline, oropharynx is clear and moist and mucous membranes are normal. No trismus in the jaw. No oropharyngeal exudate, posterior oropharyngeal erythema or tonsillar  abscesses.  Posterior oropharynx with erythema. No exudate. No swelling. No trismus. Full ROM of neck without discomfort.   Eyes: Conjunctivae and EOM are normal. Pupils are equal, round, and reactive to light.  Neck: Normal range of motion. Neck supple. No tracheal deviation present.  Cardiovascular: Normal rate, regular rhythm, S1 normal, S2 normal, normal heart sounds, intact distal pulses and normal pulses.   Pulmonary/Chest: Effort normal and breath sounds normal. No respiratory distress. She has no decreased breath sounds. She has no wheezes. She has no rhonchi. She has no rales.  Abdominal: Normal appearance and bowel sounds are normal. She exhibits no distension. There is no tenderness.  Musculoskeletal: Normal range of motion.  Neurological: She is alert and oriented to person, place, and time.  Skin: Skin is warm and dry.  Psychiatric: She has a normal mood and affect. Her speech is normal and behavior is normal. Thought content normal.  Nursing note and vitals reviewed.    ED Treatments / Results  DIAGNOSTIC STUDIES:  Oxygen Saturation is 100% on RA, normal by my interpretation.    COORDINATION OF CARE:  6:23 PM Discussed treatment plan with pt at bedside and pt agreed to plan.   Labs (all labs ordered are listed, but only abnormal results are displayed) Labs Reviewed  RAPID STREP SCREEN (NOT AT [facility])  CULTURE, GROUP A STREP [facility])  INFLUENZA PANEL BY PCR (TYPE A & B)    EKG  EKG Interpretation None       Radiology Dg Chest 2 View  Result Date: 09/28/2016 CLINICAL DATA:  Fever and cough EXAM: CHEST  2 VIEW COMPARISON:  Chest radiograph 10/11/2013 FINDINGS: The heart size and mediastinal contours are within normal limits. Both lungs are clear. The visualized skeletal structures are unremarkable. IMPRESSION: No active cardiopulmonary disease. Electronically Signed   By: Deatra Robinson M.D.   On: 09/28/2016 19:25    Procedures Procedures (including critical care  time)  Medications Ordered in ED Medications  acetaminophen (TYLENOL) tablet 650 mg (650 mg Oral Given 09/28/16 1752)     Initial Impression / Assessment and Plan / ED Course  I have reviewed the triage vital signs and the nursing notes.  Pertinent labs & imaging results that were available during my care of the patient were reviewed by me and considered in my medical decision making (see chart for details).  Final Clinical Impressions(s) / ED Diagnoses  {I have reviewed and evaluated the relevant laboratory values. {I have reviewed and evaluated the relevant imaging studies.  {I have reviewed the relevant previous healthcare records.  {I obtained HPI from historian.   ED Course:  Assessment: Patient with symptoms consistent with influenza.  Vitals are stable, low-grade fever.  No signs of dehydration, tolerating PO's.  Lungs are clear. Rapid Strep negative. CXR negative.  Patient will be discharged with instructions to orally hydrate, rest, and use over-the-counter medications such as anti-inflammatories ibuprofen and Aleve for  muscle aches and Tylenol for fever. Fever responded to Tylenol in ED. Given Tamiflu. Patient will also be given a cough suppressant.    Disposition/Plan:  DC Home Additional Verbal discharge instructions given and discussed with patient.  Pt Instructed to f/u with PCP in the next week for evaluation and treatment of symptoms. Return precautions given Pt acknowledges and agrees with plan  Supervising Physician Alvira Monday, MD  Final diagnoses:  Flu-like symptoms    New Prescriptions New Prescriptions   No medications on file   I personally performed the services described in this documentation, which was scribed in my presence. The recorded information has been reviewed and is accurate.    Audry Pili, PA-C 09/28/16 2015    Alvira Monday, MD 09/30/16 [phone]

## 2016-09-28 NOTE — ED Notes (Signed)
Patient transported to X-ray 

## 2016-10-01 LAB — CULTURE, GROUP A STREP (THRC)

## 2016-11-06 ENCOUNTER — Inpatient Hospital Stay (HOSPITAL_COMMUNITY)
Admission: AD | Admit: 2016-11-06 | Discharge: 2016-11-06 | Discharge status: Against Medical Advice | Payer: Medicaid Other | Source: Ambulatory Visit | Attending: Obstetrics and Gynecology | Admitting: Obstetrics and Gynecology

## 2016-11-06 DIAGNOSIS — R109 Unspecified abdominal pain: Secondary | ICD-10-CM | POA: Diagnosis present

## 2016-11-06 LAB — URINALYSIS, ROUTINE W REFLEX MICROSCOPIC
BILIRUBIN URINE: NEGATIVE
Glucose, UA: NEGATIVE mg/dL
HGB URINE DIPSTICK: NEGATIVE
KETONES UR: NEGATIVE mg/dL
Leukocytes, UA: NEGATIVE
Nitrite: NEGATIVE
PH: 8 (ref 5.0–8.0)
Protein, ur: NEGATIVE mg/dL
Specific Gravity, Urine: 1.013 (ref 1.005–1.030)

## 2016-11-06 LAB — POCT PREGNANCY, URINE: Preg Test, Ur: NEGATIVE

## 2016-11-06 NOTE — MAU Note (Signed)
Pt C/O lower abd cramping, last period was earlier in May, took four HPT's - were negative.  Pain started 2 weeks ago, has white discharge, states it is "different."

## 2016-11-06 NOTE — MAU Note (Signed)
Pt not in lobby, did not inform staff she was leaving. 

## 2016-11-06 NOTE — MAU Note (Signed)
Pt is not in lobby 

## 2016-11-06 NOTE — MAU Note (Signed)
Pt called, not in lobby 

## 2016-11-07 ENCOUNTER — Inpatient Hospital Stay (HOSPITAL_COMMUNITY)
Admission: AD | Admit: 2016-11-07 | Discharge: 2016-11-07 | Discharge status: Against Medical Advice | Payer: Medicaid Other | Source: Ambulatory Visit | Attending: Family Medicine | Admitting: Family Medicine

## 2016-11-07 ENCOUNTER — Encounter (HOSPITAL_COMMUNITY): Payer: Self-pay | Admitting: *Deleted

## 2016-11-07 DIAGNOSIS — Z5321 Procedure and treatment not carried out due to patient leaving prior to being seen by health care provider: Secondary | ICD-10-CM | POA: Diagnosis not present

## 2016-11-07 DIAGNOSIS — R109 Unspecified abdominal pain: Secondary | ICD-10-CM | POA: Insufficient documentation

## 2016-11-07 LAB — URINALYSIS, ROUTINE W REFLEX MICROSCOPIC
BILIRUBIN URINE: NEGATIVE
Glucose, UA: NEGATIVE mg/dL
Hgb urine dipstick: NEGATIVE
Ketones, ur: NEGATIVE mg/dL
Leukocytes, UA: NEGATIVE
NITRITE: NEGATIVE
Protein, ur: NEGATIVE mg/dL
SPECIFIC GRAVITY, URINE: 1.019 (ref 1.005–1.030)
pH: 7 (ref 5.0–8.0)

## 2016-11-07 LAB — POCT PREGNANCY, URINE: PREG TEST UR: NEGATIVE

## 2016-11-07 NOTE — MAU Note (Signed)
PT  SAYS LMP  WAS 5-1-    WAS   HERE YESTERDAY -  BUT  LEFT  AFTER  TRIAGE .     DID HPT-  YESTEDAY- NEG.     SHE FEELS  CRAMPING- STARTED 2  WEEKS  AGO      NO BIRTH CONTROL -  [facility]     LAST SEX-  Tuesday.

## 2016-11-07 NOTE — MAU Note (Signed)
NOT IN LOBBY 

## 2016-11-07 NOTE — MAU Note (Signed)
Not in lobby

## 2016-11-07 NOTE — MAU Note (Signed)
Patient not in lobby

## 2016-11-20 ENCOUNTER — Ambulatory Visit: Payer: Medicaid Other | Admitting: Obstetrics

## 2016-12-02 IMAGING — US US OB COMP LESS 14 WK
1 series · 15 of 28 positions shown · non-contrast
Comparison: None.

CLINICAL DATA: Abdominal pain, pregnant

EXAM:
OBSTETRIC <14 WK US AND TRANSVAGINAL OB US
TECHNIQUE: Both transabdominal and transvaginal ultrasound examinations were
performed for complete evaluation of the gestation as well as the
maternal uterus, adnexal regions, and pelvic cul-de-sac.
Transvaginal technique was performed to assess early pregnancy.

[Series 1: us ob comp less 14 wk · 15 of 54 slices shown]
[im 1/54]
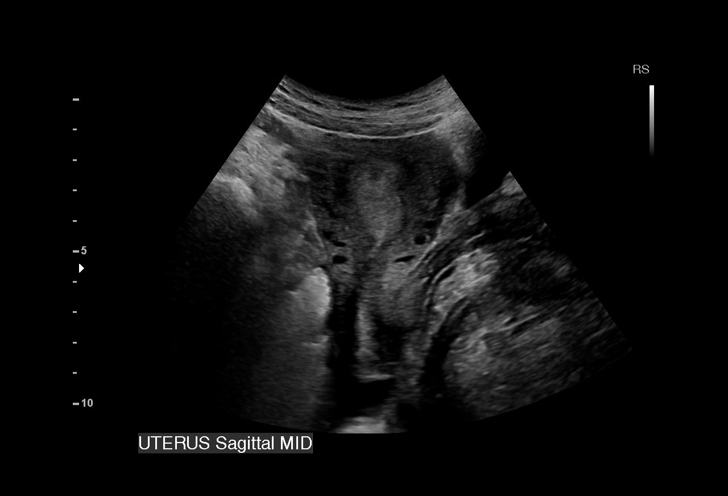
[im 4/54]
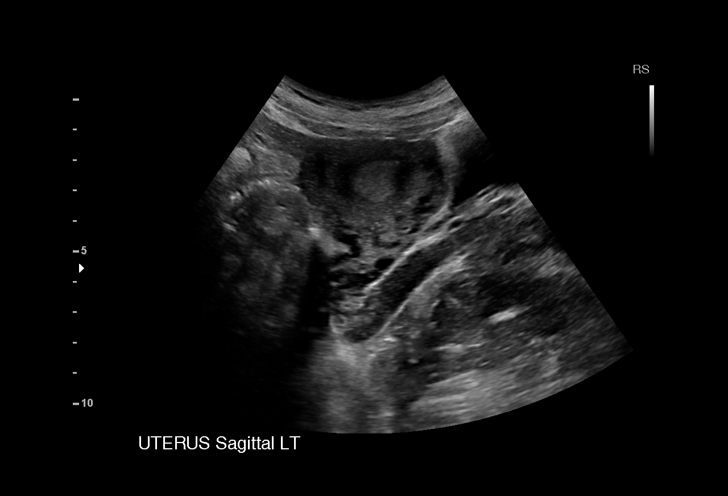
[im 8/54]
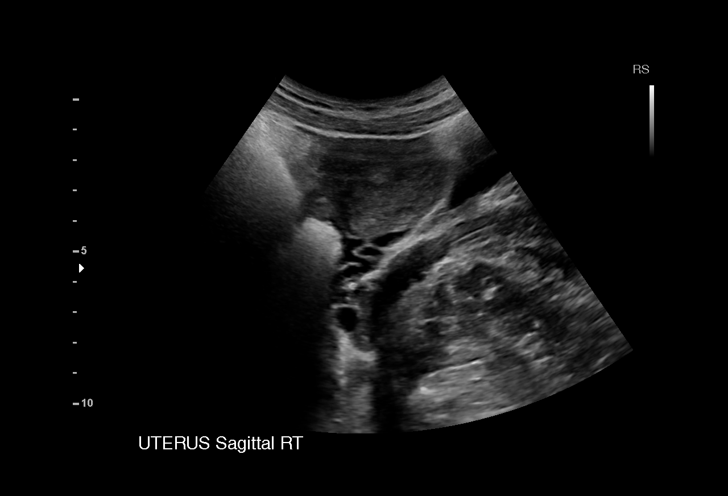
[im 12/54]
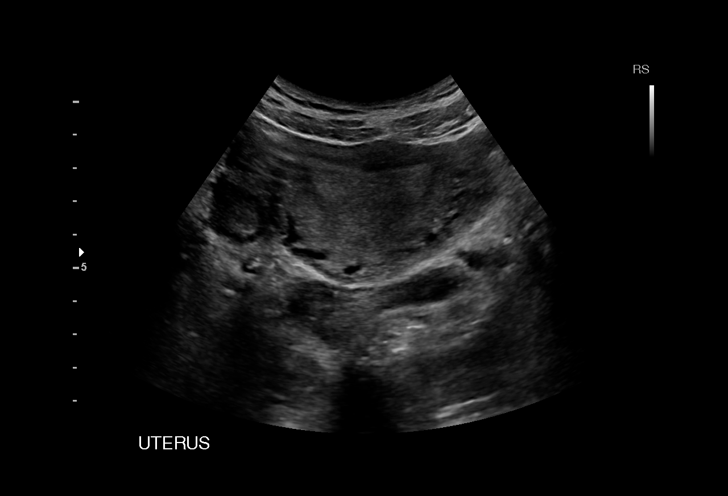
[im 16/54]
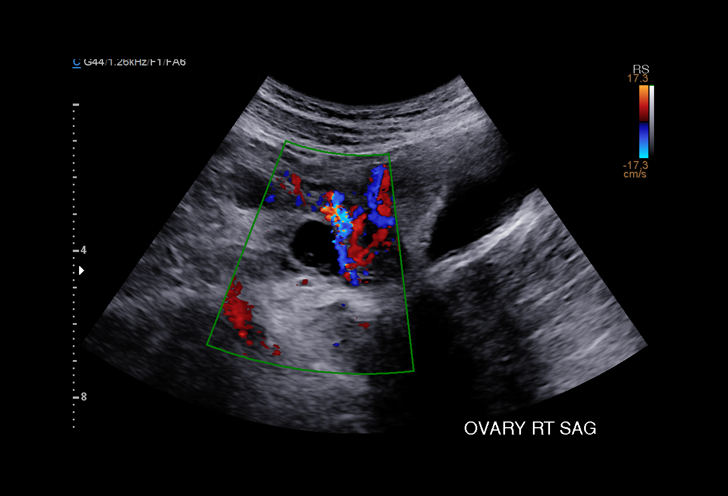
[im 20/54]
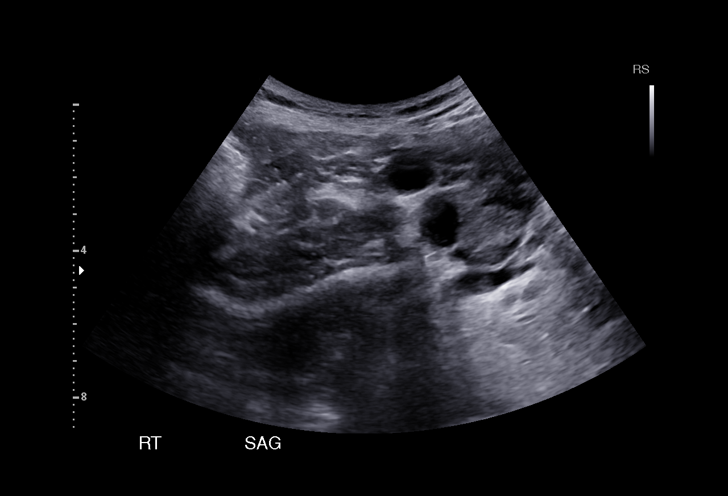
[im 24/54]
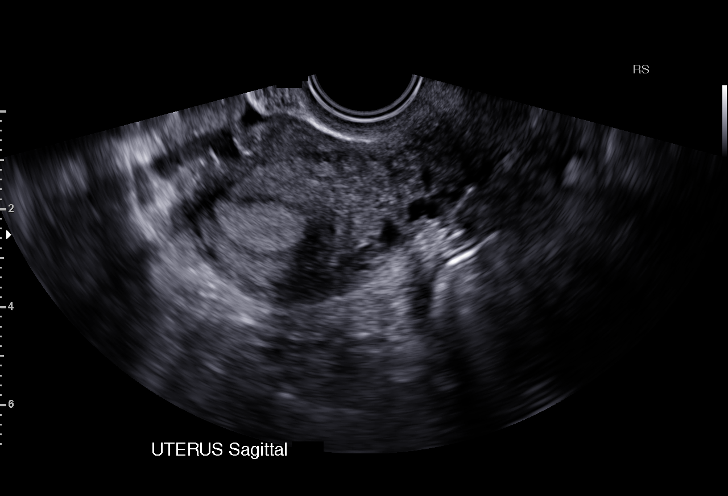
[im 28/54]
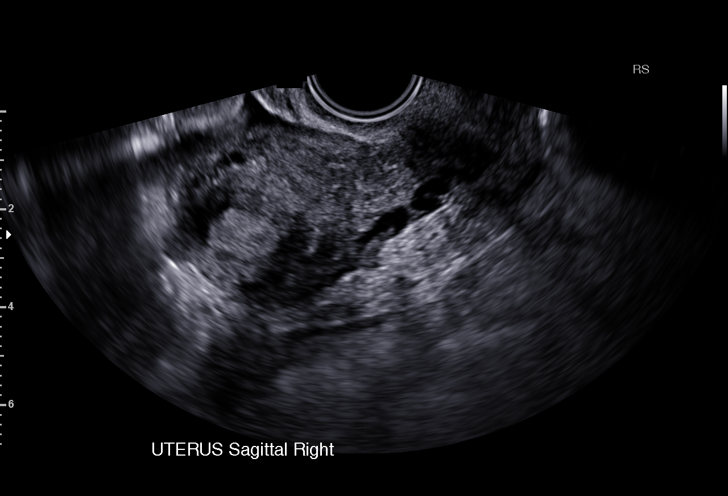
[im 30/54]
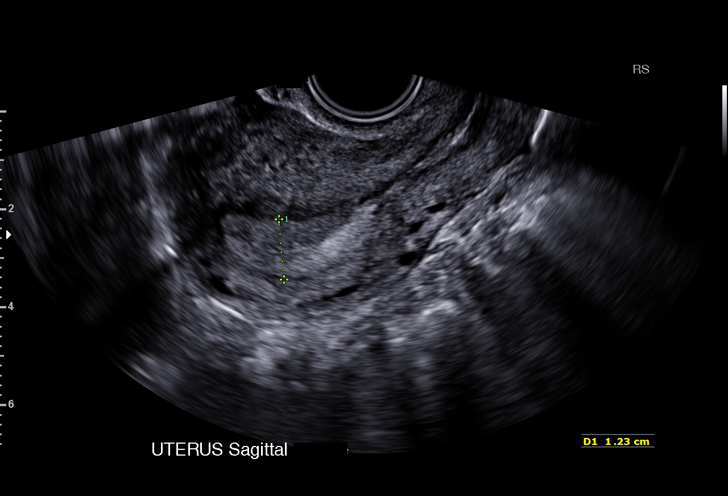
[im 34/54]
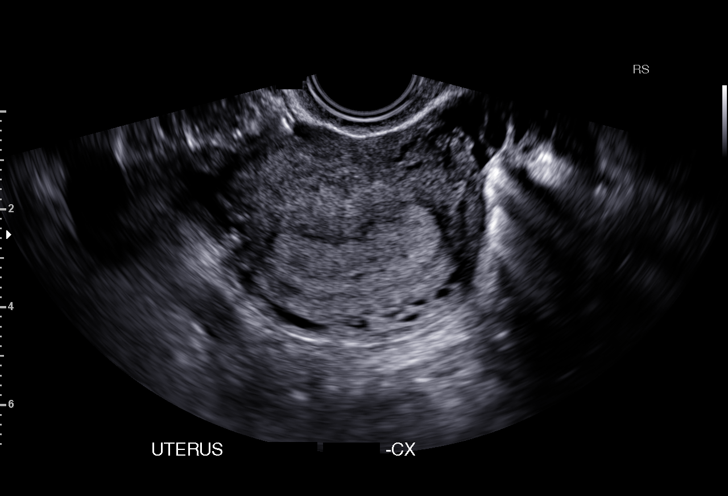
[im 38/54]
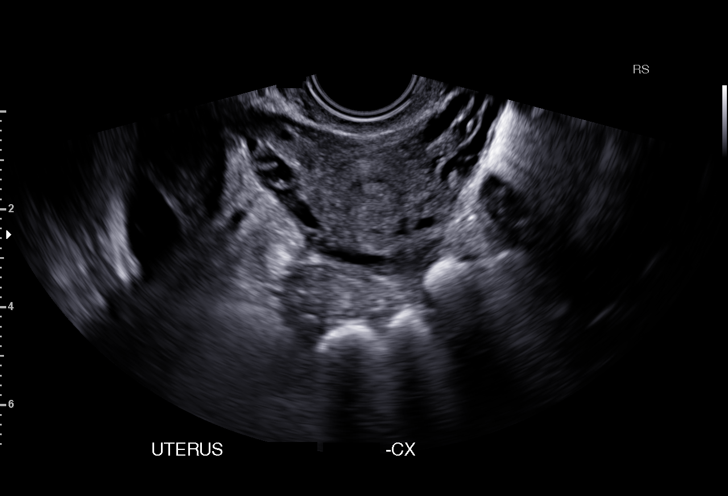
[im 42/54]
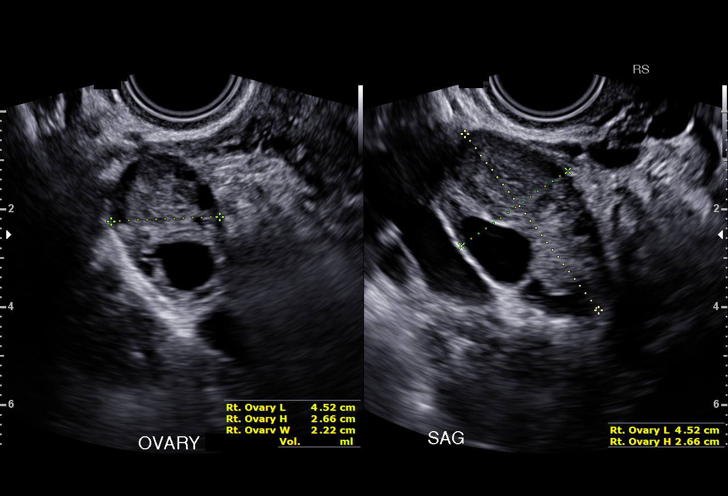
[im 46/54]
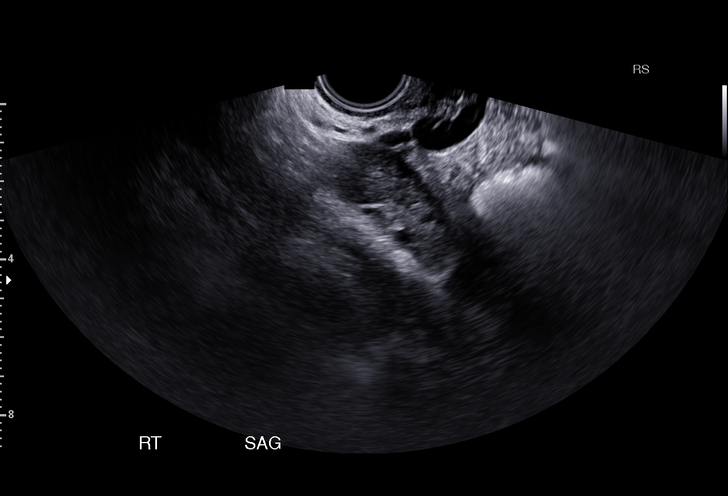
[im 50/54]
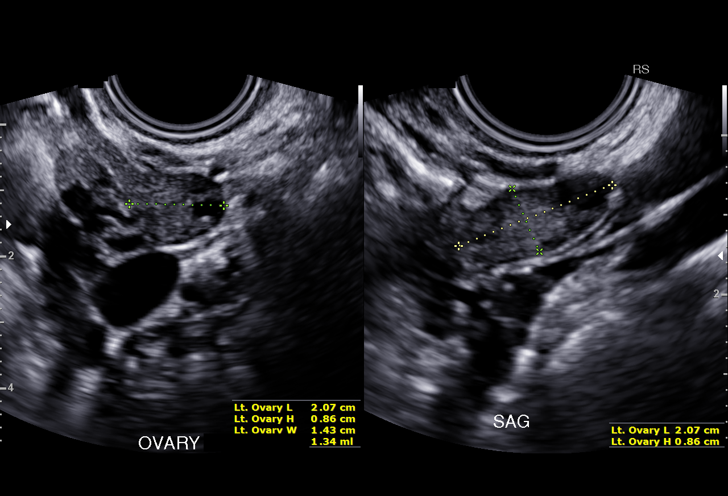
[im 54/54]
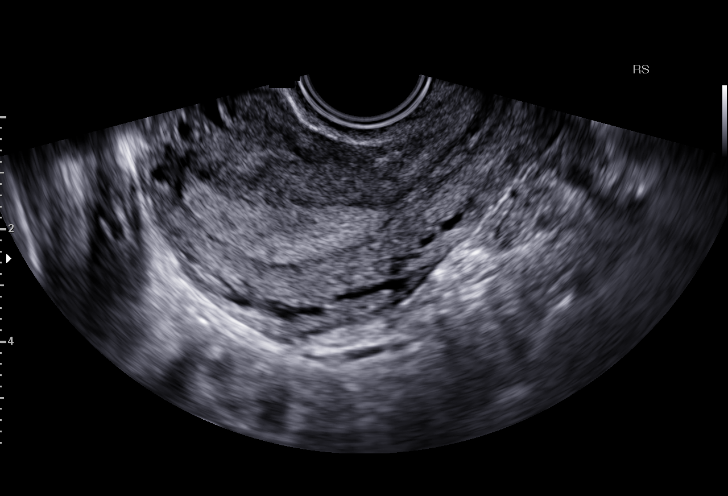

[15 of 28 positions shown; findings below may reference images not displayed]

FINDINGS: Intrauterine gestational sac: Not present.

Yolk sac:  Not present.

Embryo:  Not present.

Cardiac Activity: Not present.

Maternal uterus/adnexae: No adnexal mass. Bilateral ovaries are
normal in size and echogenicity. Small hypoechoic right ovarian mass
likely representing a corpus luteum cyst of pregnancy. Right ovary
measures 4.5 x 2.7 x 2.2 cm. Left ovary measures 2.1 x 0.9 x 1.4 cm.
No pelvic free fluid.
IMPRESSION: 1. No intrauterine pregnancy is identified. Differential diagnosis
includes a pregnancy too early to detect versus missed abortion
versus ectopic pregnancy. Recommend clinical correlation, serial
quantitative beta HCGs, ectopic precautions, and followup ultrasound
as clinically indicated.

## 2017-06-10 NOTE — L&D Delivery Note (Signed)
OB/GYN Faculty Practice Delivery Note  Coty Ninfa Meeker is a 27 y.o. [phone] s/p precipitous NSVD at [redacted]w[redacted]d. She was admitted for labor.   ROM: delivered in caul, clear GBS Status: negative Maximum Maternal Temperature: 98.1  Labor Progress: . Seen in MAU, quickly progressed; transferred to birthing suite where she had precipitous delivery  Delivery Date/Time: 03/28/18 0655 Delivery: Called to room and patient was complete and pushing. Head delivered LOA en caul. No nuchal cord present. Shoulder and body delivered in usual fashion. Infant with spontaneous cry, placed on mother's abdomen, dried and stimulated. Cord clamped x 2 after 1-minute delay, and cut by me. Cord blood drawn. Placenta delivered spontaneously with gentle cord traction. Fundus firm with massage and IM Pitocin, however persistent bleeding; given rectal cytotec 1000 mcg, methergine 0.1 mcg IM, and started IV pitocin; firm after this. Labia, perineum, vagina, and cervix inspected inspected with no lacerations.   Placenta: intact Complications: bleeding Lacerations: none QBL: 731  Postpartum Planning [x]  message to sent to schedule follow-up  [x]  vaccines UTD  Infant: Earney Navy pending  pending weight  Aura Camps, MD OB/GYN Fellow, Faculty Practice

## 2017-09-11 DIAGNOSIS — Z3481 Encounter for supervision of other normal pregnancy, first trimester: Secondary | ICD-10-CM | POA: Diagnosis not present

## 2017-09-11 LAB — OB RESULTS CONSOLE GC/CHLAMYDIA
Chlamydia: NEGATIVE
GC PROBE AMP, GENITAL: NEGATIVE

## 2017-09-11 LAB — OB RESULTS CONSOLE RPR: RPR: NONREACTIVE

## 2017-09-11 LAB — OB RESULTS CONSOLE HIV ANTIBODY (ROUTINE TESTING): HIV: NONREACTIVE

## 2017-09-11 LAB — OB RESULTS CONSOLE PLATELET COUNT: Platelets: 349

## 2017-09-11 LAB — OB RESULTS CONSOLE RUBELLA ANTIBODY, IGM: Rubella: IMMUNE

## 2017-09-11 LAB — OB RESULTS CONSOLE ABO/RH: RH TYPE: POSITIVE

## 2017-09-11 LAB — OB RESULTS CONSOLE VARICELLA ZOSTER ANTIBODY, IGG: Varicella: IMMUNE

## 2017-09-11 LAB — OB RESULTS CONSOLE HGB/HCT, BLOOD
HCT: 37
HEMOGLOBIN: 12.8

## 2017-09-11 LAB — OB RESULTS CONSOLE ANTIBODY SCREEN: Antibody Screen: NEGATIVE

## 2017-09-11 LAB — OB RESULTS CONSOLE HEPATITIS B SURFACE ANTIGEN: Hepatitis B Surface Ag: NEGATIVE

## 2017-11-18 DIAGNOSIS — O358XX0 Maternal care for other (suspected) fetal abnormality and damage, not applicable or unspecified: Secondary | ICD-10-CM | POA: Diagnosis not present

## 2017-11-18 DIAGNOSIS — O283 Abnormal ultrasonic finding on antenatal screening of mother: Secondary | ICD-10-CM | POA: Diagnosis not present

## 2017-12-25 DIAGNOSIS — Z3482 Encounter for supervision of other normal pregnancy, second trimester: Secondary | ICD-10-CM | POA: Diagnosis not present

## 2017-12-25 DIAGNOSIS — Z1389 Encounter for screening for other disorder: Secondary | ICD-10-CM | POA: Diagnosis not present

## 2018-01-27 DIAGNOSIS — Z3483 Encounter for supervision of other normal pregnancy, third trimester: Secondary | ICD-10-CM | POA: Diagnosis not present

## 2018-01-28 DIAGNOSIS — Z3483 Encounter for supervision of other normal pregnancy, third trimester: Secondary | ICD-10-CM | POA: Diagnosis not present

## 2018-01-28 LAB — OB RESULTS CONSOLE HGB/HCT, BLOOD
HEMATOCRIT: 31
Hemoglobin: 10.2

## 2018-01-28 LAB — OB RESULTS CONSOLE PLATELET COUNT: Platelets: 303

## 2018-01-29 DIAGNOSIS — O99810 Abnormal glucose complicating pregnancy: Secondary | ICD-10-CM | POA: Diagnosis not present

## 2018-02-10 DIAGNOSIS — Z3483 Encounter for supervision of other normal pregnancy, third trimester: Secondary | ICD-10-CM | POA: Diagnosis not present

## 2018-02-10 DIAGNOSIS — Z3A30 30 weeks gestation of pregnancy: Secondary | ICD-10-CM | POA: Diagnosis not present

## 2018-02-23 DIAGNOSIS — O358XX0 Maternal care for other (suspected) fetal abnormality and damage, not applicable or unspecified: Secondary | ICD-10-CM | POA: Diagnosis not present

## 2018-03-18 ENCOUNTER — Encounter: Payer: Self-pay | Admitting: Obstetrics

## 2018-03-18 ENCOUNTER — Ambulatory Visit (INDEPENDENT_AMBULATORY_CARE_PROVIDER_SITE_OTHER): Payer: Medicaid Other | Admitting: Obstetrics

## 2018-03-18 VITALS — BP 122/80 | HR 92 | Wt 185.8 lb

## 2018-03-18 DIAGNOSIS — O26899 Other specified pregnancy related conditions, unspecified trimester: Secondary | ICD-10-CM | POA: Diagnosis not present

## 2018-03-18 DIAGNOSIS — Z3483 Encounter for supervision of other normal pregnancy, third trimester: Secondary | ICD-10-CM

## 2018-03-18 DIAGNOSIS — O358XX0 Maternal care for other (suspected) fetal abnormality and damage, not applicable or unspecified: Secondary | ICD-10-CM | POA: Diagnosis not present

## 2018-03-18 DIAGNOSIS — Z3689 Encounter for other specified antenatal screening: Secondary | ICD-10-CM | POA: Diagnosis not present

## 2018-03-18 DIAGNOSIS — R12 Heartburn: Secondary | ICD-10-CM | POA: Diagnosis not present

## 2018-03-18 DIAGNOSIS — Z3480 Encounter for supervision of other normal pregnancy, unspecified trimester: Secondary | ICD-10-CM

## 2018-03-18 DIAGNOSIS — O35EXX0 Maternal care for other (suspected) fetal abnormality and damage, fetal genitourinary anomalies, not applicable or unspecified: Secondary | ICD-10-CM

## 2018-03-18 MED ORDER — OMEPRAZOLE 20 MG PO CPDR: 20.0000 mg | DELAYED_RELEASE_CAPSULE | Freq: Two times a day (BID) | ORAL | 5 refills | Status: DC
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
## 2018-03-23 ENCOUNTER — Ambulatory Visit (HOSPITAL_COMMUNITY)
Admission: RE | Admit: 2018-03-23 | Discharge: 2018-03-23 | Disposition: A | Discharge status: Home / Self Care | Payer: Medicaid Other | Source: Ambulatory Visit | Attending: Obstetrics | Admitting: Obstetrics

## 2018-03-23 DIAGNOSIS — O35EXX0 Maternal care for other (suspected) fetal abnormality and damage, fetal genitourinary anomalies, not applicable or unspecified: Secondary | ICD-10-CM

## 2018-03-23 DIAGNOSIS — Z363 Encounter for antenatal screening for malformations: Secondary | ICD-10-CM | POA: Diagnosis not present

## 2018-03-23 DIAGNOSIS — O359XX0 Maternal care for (suspected) fetal abnormality and damage, unspecified, not applicable or unspecified: Secondary | ICD-10-CM | POA: Diagnosis not present

## 2018-03-23 DIAGNOSIS — Z3A36 36 weeks gestation of pregnancy: Secondary | ICD-10-CM | POA: Diagnosis not present

## 2018-03-23 DIAGNOSIS — Z3689 Encounter for other specified antenatal screening: Secondary | ICD-10-CM

## 2018-03-23 DIAGNOSIS — O358XX0 Maternal care for other (suspected) fetal abnormality and damage, not applicable or unspecified: Secondary | ICD-10-CM | POA: Diagnosis not present

## 2018-03-25 ENCOUNTER — Encounter: Payer: Self-pay | Admitting: Obstetrics

## 2018-03-25 ENCOUNTER — Ambulatory Visit (INDEPENDENT_AMBULATORY_CARE_PROVIDER_SITE_OTHER): Payer: Medicaid Other | Admitting: Obstetrics

## 2018-03-25 ENCOUNTER — Other Ambulatory Visit (HOSPITAL_COMMUNITY)
Admission: RE | Admit: 2018-03-25 | Discharge: 2018-03-25 | Disposition: A | Discharge status: Home / Self Care | Payer: Medicaid Other | Source: Ambulatory Visit | Attending: Obstetrics | Admitting: Obstetrics

## 2018-03-25 VITALS — BP 114/66 | HR 87 | Wt 185.0 lb

## 2018-03-25 DIAGNOSIS — Z3480 Encounter for supervision of other normal pregnancy, unspecified trimester: Secondary | ICD-10-CM | POA: Diagnosis not present

## 2018-03-25 DIAGNOSIS — Z3A36 36 weeks gestation of pregnancy: Secondary | ICD-10-CM | POA: Diagnosis not present

## 2018-03-25 DIAGNOSIS — Z23 Encounter for immunization: Secondary | ICD-10-CM | POA: Diagnosis not present

## 2018-03-25 DIAGNOSIS — Z3483 Encounter for supervision of other normal pregnancy, third trimester: Secondary | ICD-10-CM | POA: Insufficient documentation

## 2018-03-25 NOTE — Progress Notes (Signed)
Subjective:  Amoree Ninfa Meeker is a 27 y.o. [phone] at [redacted]w[redacted]d being seen today for ongoing prenatal care.  She is currently monitored for the following issues for this low-risk pregnancy and has Normal labor; NVD (normal vaginal delivery); and Supervision of other normal pregnancy, antepartum on their problem list.  Patient reports no complaints.  Contractions: Irregular. Vag. Bleeding: None.  Movement: Present. Denies leaking of fluid.   The following portions of the patient's history were reviewed and updated as appropriate: allergies, current medications, past family history, past medical history, past social history, past surgical history and problem list. Problem list updated.  Objective:   Vitals:   03/25/18 1103  BP: 114/66  Pulse: 87  Weight: 185 lb (83.9 kg)    Fetal Status: Fetal Heart Rate (bpm): 150   Movement: Present     General:  Alert, oriented and cooperative. Patient is in no acute distress.  Skin: Skin is warm and dry. No rash noted.   Cardiovascular: Normal heart rate noted  Respiratory: Normal respiratory effort, no problems with respiration noted  Abdomen: Soft, gravid, appropriate for gestational age. Pain/Pressure: Present     Pelvic:  Cervical exam deferred        Extremities: Normal range of motion.  Edema: None  Mental Status: Normal mood and affect. Normal behavior. Normal judgment and thought content.   Urinalysis:      Assessment and Plan:  Pregnancy: [id number] at [redacted]w[redacted]d  1. Supervision of other normal pregnancy, antepartum Rx: - Strep Gp B NAA - Cervicovaginal ancillary only  Term labor symptoms and general obstetric precautions including but not limited to vaginal bleeding, contractions, leaking of fluid and fetal movement were reviewed in detail with the patient. Please refer to After Visit Summary for other counseling recommendations.  Return in about 1 week (around 04/01/2018) for ROB.   Brock Bad, MD

## 2018-03-26 LAB — CERVICOVAGINAL ANCILLARY ONLY
Bacterial vaginitis: NEGATIVE
CANDIDA VAGINITIS: POSITIVE — AB
CHLAMYDIA, DNA PROBE: NEGATIVE
NEISSERIA GONORRHEA: NEGATIVE
Trichomonas: NEGATIVE

## 2018-03-27 ENCOUNTER — Other Ambulatory Visit (HOSPITAL_COMMUNITY): Payer: Self-pay | Admitting: *Deleted

## 2018-03-27 ENCOUNTER — Other Ambulatory Visit: Payer: Self-pay | Admitting: Obstetrics

## 2018-03-27 DIAGNOSIS — B3731 Acute candidiasis of vulva and vagina: Secondary | ICD-10-CM

## 2018-03-27 DIAGNOSIS — B373 Candidiasis of vulva and vagina: Secondary | ICD-10-CM

## 2018-03-27 DIAGNOSIS — Z362 Encounter for other antenatal screening follow-up: Secondary | ICD-10-CM

## 2018-03-27 LAB — STREP GP B NAA: Strep Gp B NAA: NEGATIVE

## 2018-03-27 MED ORDER — TERCONAZOLE 0.8 % VA CREA: 1.0000 | TOPICAL_CREAM | Freq: Every day | VAGINAL | 0 refills | Status: DC

## 2018-03-28 ENCOUNTER — Inpatient Hospital Stay (HOSPITAL_COMMUNITY)
Admission: AD | Admit: 2018-03-28 | Discharge: 2018-03-30 | DRG: 807 | Disposition: A | Discharge status: Home / Self Care | Payer: Medicaid Other | Attending: Obstetrics and Gynecology | Admitting: Obstetrics and Gynecology

## 2018-03-28 ENCOUNTER — Other Ambulatory Visit: Payer: Self-pay

## 2018-03-28 ENCOUNTER — Encounter (HOSPITAL_COMMUNITY): Payer: Self-pay

## 2018-03-28 DIAGNOSIS — Z3490 Encounter for supervision of normal pregnancy, unspecified, unspecified trimester: Secondary | ICD-10-CM

## 2018-03-28 DIAGNOSIS — O358XX0 Maternal care for other (suspected) fetal abnormality and damage, not applicable or unspecified: Secondary | ICD-10-CM | POA: Diagnosis not present

## 2018-03-28 DIAGNOSIS — Z3A37 37 weeks gestation of pregnancy: Secondary | ICD-10-CM | POA: Diagnosis not present

## 2018-03-28 DIAGNOSIS — Z87891 Personal history of nicotine dependence: Secondary | ICD-10-CM

## 2018-03-28 DIAGNOSIS — Z3483 Encounter for supervision of other normal pregnancy, third trimester: Secondary | ICD-10-CM | POA: Diagnosis not present

## 2018-03-28 LAB — TYPE AND SCREEN
ABO/RH(D): B POS
Antibody Screen: NEGATIVE

## 2018-03-28 LAB — CBC
HCT: 32.3 % — ABNORMAL LOW (ref 36.0–46.0)
HEMOGLOBIN: 10.4 g/dL — AB (ref 12.0–15.0)
MCH: 26.6 pg (ref 26.0–34.0)
MCHC: 32.2 g/dL (ref 30.0–36.0)
MCV: 82.6 fL (ref 80.0–100.0)
PLATELETS: 292 10*3/uL (ref 150–400)
RBC: 3.91 MIL/uL (ref 3.87–5.11)
RDW: 13.8 % (ref 11.5–15.5)
WBC: 15.6 10*3/uL — ABNORMAL HIGH (ref 4.0–10.5)
nRBC: 0 % (ref 0.0–0.2)

## 2018-03-28 LAB — RPR: RPR: NONREACTIVE

## 2018-03-28 LAB — AMNISURE RUPTURE OF MEMBRANE (ROM) NOT AT ARMC: Amnisure ROM: POSITIVE

## 2018-03-28 LAB — POCT FERN TEST: POCT Fern Test: NEGATIVE

## 2018-03-28 MED ORDER — MISOPROSTOL 200 MCG PO TABS
ORAL_TABLET | ORAL | Status: AC
  Administered 2018-03-28: 1000 ug
  Filled 2018-03-28: qty 5

## 2018-03-28 MED ORDER — METHYLERGONOVINE MALEATE 0.2 MG/ML IJ SOLN
0.2000 mg | Freq: Once | INTRAMUSCULAR | Status: AC
  Administered 2018-03-28: 0.2 mg via INTRAMUSCULAR

## 2018-03-28 MED ORDER — WITCH HAZEL-GLYCERIN EX PADS: 1.0000 "application " | MEDICATED_PAD | CUTANEOUS | Status: DC | PRN

## 2018-03-28 MED ORDER — OXYCODONE HCL 5 MG PO TABS
5.0000 mg | ORAL_TABLET | Freq: Four times a day (QID) | ORAL | Status: DC | PRN
  Administered 2018-03-28 – 2018-03-30 (×3): 5 mg via ORAL
  Filled 2018-03-28 (×3): qty 1

## 2018-03-28 MED ORDER — PRENATAL MULTIVITAMIN CH
1.0000 | ORAL_TABLET | Freq: Every day | ORAL | Status: DC
  Administered 2018-03-28 – 2018-03-30 (×3): 1 via ORAL
  Filled 2018-03-28 (×2): qty 1

## 2018-03-28 MED ORDER — IBUPROFEN 600 MG PO TABS
600.0000 mg | ORAL_TABLET | Freq: Four times a day (QID) | ORAL | Status: DC
  Administered 2018-03-28 – 2018-03-30 (×10): 600 mg via ORAL
  Filled 2018-03-28 (×10): qty 1

## 2018-03-28 MED ORDER — OXYTOCIN 10 UNIT/ML IJ SOLN
INTRAMUSCULAR | Status: AC
  Administered 2018-03-28: 10 [IU]
  Filled 2018-03-28: qty 1

## 2018-03-28 MED ORDER — LIDOCAINE HCL (PF) 1 % IJ SOLN
30.0000 mL | INTRAMUSCULAR | Status: DC | PRN
  Filled 2018-03-28: qty 30

## 2018-03-28 MED ORDER — LACTATED RINGERS IV SOLN: INTRAVENOUS | Status: DC

## 2018-03-28 MED ORDER — OXYCODONE-ACETAMINOPHEN 5-325 MG PO TABS: 2.0000 | ORAL_TABLET | ORAL | Status: DC | PRN

## 2018-03-28 MED ORDER — BENZOCAINE-MENTHOL 20-0.5 % EX AERO
1.0000 "application " | INHALATION_SPRAY | CUTANEOUS | Status: DC | PRN
  Administered 2018-03-28: 1 via TOPICAL
  Filled 2018-03-28: qty 56

## 2018-03-28 MED ORDER — DIPHENHYDRAMINE HCL 25 MG PO CAPS: 25.0000 mg | ORAL_CAPSULE | Freq: Four times a day (QID) | ORAL | Status: DC | PRN

## 2018-03-28 MED ORDER — LIDOCAINE HCL (PF) 1 % IJ SOLN
INTRAMUSCULAR | Status: AC
  Filled 2018-03-28: qty 30

## 2018-03-28 MED ORDER — ONDANSETRON HCL 4 MG/2ML IJ SOLN: 4.0000 mg | INTRAMUSCULAR | Status: DC | PRN

## 2018-03-28 MED ORDER — OXYTOCIN 40 UNITS IN LACTATED RINGERS INFUSION - SIMPLE MED
INTRAVENOUS | Status: AC
  Filled 2018-03-28: qty 1000

## 2018-03-28 MED ORDER — LACTATED RINGERS IV SOLN: 500.0000 mL | INTRAVENOUS | Status: DC | PRN

## 2018-03-28 MED ORDER — ONDANSETRON HCL 4 MG/2ML IJ SOLN: 4.0000 mg | Freq: Four times a day (QID) | INTRAMUSCULAR | Status: DC | PRN

## 2018-03-28 MED ORDER — SIMETHICONE 80 MG PO CHEW: 80.0000 mg | CHEWABLE_TABLET | ORAL | Status: DC | PRN

## 2018-03-28 MED ORDER — PANTOPRAZOLE SODIUM 40 MG PO TBEC: 40.0000 mg | DELAYED_RELEASE_TABLET | Freq: Every day | ORAL | Status: DC

## 2018-03-28 MED ORDER — DIBUCAINE 1 % RE OINT: 1.0000 "application " | TOPICAL_OINTMENT | RECTAL | Status: DC | PRN

## 2018-03-28 MED ORDER — SENNOSIDES-DOCUSATE SODIUM 8.6-50 MG PO TABS
2.0000 | ORAL_TABLET | ORAL | Status: DC
  Administered 2018-03-28 – 2018-03-30 (×2): 2 via ORAL
  Filled 2018-03-28 (×2): qty 2

## 2018-03-28 MED ORDER — OXYTOCIN 40 UNITS IN LACTATED RINGERS INFUSION - SIMPLE MED
2.5000 [IU]/h | INTRAVENOUS | Status: DC
  Administered 2018-03-28: 2.5 [IU]/h via INTRAVENOUS

## 2018-03-28 MED ORDER — ZOLPIDEM TARTRATE 5 MG PO TABS: 5.0000 mg | ORAL_TABLET | Freq: Every evening | ORAL | Status: DC | PRN

## 2018-03-28 MED ORDER — OXYCODONE-ACETAMINOPHEN 5-325 MG PO TABS: 1.0000 | ORAL_TABLET | ORAL | Status: DC | PRN

## 2018-03-28 MED ORDER — COCONUT OIL OIL: 1.0000 "application " | TOPICAL_OIL | Status: DC | PRN

## 2018-03-28 MED ORDER — ACETAMINOPHEN 325 MG PO TABS
650.0000 mg | ORAL_TABLET | ORAL | Status: DC | PRN
  Administered 2018-03-28 – 2018-03-29 (×5): 650 mg via ORAL
  Filled 2018-03-28 (×5): qty 2

## 2018-03-28 MED ORDER — ONDANSETRON HCL 4 MG PO TABS: 4.0000 mg | ORAL_TABLET | ORAL | Status: DC | PRN

## 2018-03-28 MED ORDER — OXYTOCIN BOLUS FROM INFUSION: 500.0000 mL | Freq: Once | INTRAVENOUS | Status: DC

## 2018-03-28 MED ORDER — ACETAMINOPHEN 325 MG PO TABS: 650.0000 mg | ORAL_TABLET | ORAL | Status: DC | PRN

## 2018-03-28 MED ORDER — FENTANYL CITRATE (PF) 100 MCG/2ML IJ SOLN
INTRAMUSCULAR | Status: AC
  Filled 2018-03-28: qty 2

## 2018-03-28 MED ORDER — FENTANYL CITRATE (PF) 100 MCG/2ML IJ SOLN: 100.0000 ug | INTRAMUSCULAR | Status: DC | PRN

## 2018-03-28 MED ORDER — SOD CITRATE-CITRIC ACID 500-334 MG/5ML PO SOLN: 30.0000 mL | ORAL | Status: DC | PRN

## 2018-03-28 NOTE — MAU Note (Signed)
Pt has been contracting for several days. Woke up about an hour ago and saw blood when she was using bathroom.  Is unsure if membranes ruptured.  +FM
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
## 2018-03-29 LAB — CBC
HCT: 27.4 % — ABNORMAL LOW (ref 36.0–46.0)
Hemoglobin: 9.1 g/dL — ABNORMAL LOW (ref 12.0–15.0)
MCH: 27.4 pg (ref 26.0–34.0)
MCHC: 33.2 g/dL (ref 30.0–36.0)
MCV: 82.5 fL (ref 80.0–100.0)
NRBC: 0 % (ref 0.0–0.2)
PLATELETS: 271 10*3/uL (ref 150–400)
RBC: 3.32 MIL/uL — AB (ref 3.87–5.11)
RDW: 13.8 % (ref 11.5–15.5)
WBC: 20 10*3/uL — ABNORMAL HIGH (ref 4.0–10.5)

## 2018-03-29 MED ORDER — OXYCODONE HCL 5 MG PO TABS: 5.0000 mg | ORAL_TABLET | Freq: Once | ORAL | Status: AC
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
## 2018-03-29 NOTE — Lactation Note (Addendum)
This note was copied from a baby's chart. Lactation Consultation Note Baby 22 hrs old. Mom is breast/formula feeding. Mom didn't BF her 1st child but breast/formula fed her 2nd child for 6 months,. Mom plans to breast/formula feed this baby. Mom stated she will probably only BF until her milk comes in then go to just formula. ETI behavior, feeding habits, STS, I&O, supply and demand discussed. Gave mom LPI information sheet.  Mom encouraged to feed baby 8-12 times/24 hours and with feeding cues.  Discussed pumping d/t less than 6 lbs. And supplementing. Mom in agreement to pump. Mom knows to give colostrum first. Asked RN to set up pump. Mom states understanding. WH/LC brochure given w/resources, support groups and LC services.  Patient Name: Elizabeth [name] [id number] Date: 03/29/2018 Reason for consult: Initial assessment;Early term 37-38.6wks   Maternal Data Has patient been taught Hand Expression?: Yes Does the patient have breastfeeding experience prior to this delivery?: Yes  Feeding    LATCH Score       Type of Nipple: Everted at rest and after stimulation  Comfort (Breast/Nipple): Soft / non-tender        Interventions Interventions: Breast feeding basics reviewed;Breast massage;Hand express;Breast compression  Lactation Tools Discussed/Used WIC Program: Yes   Consult Status Consult Status: Follow-up Date: 03/30/18 Follow-up type: In-patient    Elizabeth [name], Diamond Nickel 03/29/2018, 5:35 AM

## 2018-03-30 NOTE — Discharge Summary (Signed)
OB Discharge Summary     Patient Name: Elizabeth [name] DOB: 11/09/1990 MRN: [id number]  Date of admission: 03/28/2018 Delivering MD: Aura Camps L   Date of discharge: 03/30/2018  Admitting diagnosis: 36wks water broke Intrauterine pregnancy: [redacted]w[redacted]d     Secondary diagnosis:  Active Problems:   Pregnancy  Additional problems: SVD- Precipitous delivery      Discharge diagnosis: Term Pregnancy Delivered                                                                                                Post partum procedures:none  Augmentation: None  Complications: None  Hospital course:  Onset of Labor With Vaginal Delivery     27 y.o. yo [id number] at [redacted]w[redacted]d was admitted in Active Labor on 03/28/2018. Patient had an uncomplicated labor course as follows:  Membrane Rupture Time/Date: 6:55 AM ,03/28/2018   Intrapartum Procedures: Episiotomy: None [1]                                         Lacerations:  None [1]  Patient had a delivery of a Viable infant. 03/28/2018  Information for the patient's newborn:  Cayce, Paschal [[id number]]  Delivery Method: Vag-Spont    Pateint had an uncomplicated postpartum course.  She is ambulating, tolerating a regular diet, passing flatus, and urinating well. Patient is discharged home in stable condition on 03/30/18.   Physical exam  Vitals:   03/28/18 2353 03/29/18 0540 03/29/18 1400 03/30/18 0535  BP: 117/68 104/60 (!) 96/54 120/68  Pulse: (!) 103 73 84 84  Resp: 20 19 16 18   Temp: 99.6 F (37.6 C) 98.7 F (37.1 C) 98.1 F (36.7 C) 98.4 F (36.9 C)  TempSrc: Oral Oral Oral Oral  SpO2: 100% 100% 100% 100%  Weight:      Height:       General: alert, cooperative and no distress Lochia: appropriate Uterine Fundus: firm Incision: N/A DVT Evaluation: No evidence of DVT seen on physical exam. No cords or calf tenderness. No significant calf/ankle edema. Labs: Lab Results  Component Value Date   WBC 20.0 (H) 03/29/2018   HGB 9.1 (L) 03/29/2018   HCT 27.4 (L) 03/29/2018   MCV 82.5 03/29/2018   PLT 271 03/29/2018   CMP Latest Ref Rng & Units 11/08/2015  Glucose 65 - 99 mg/dL 130(Q)  BUN 6 - 20 mg/dL 7  Creatinine 6.57 - 8.46 mg/dL 9.62  Sodium 952 - 841 mmol/L 136  Potassium 3.5 - 5.1 mmol/L 4.2  Chloride 101 - 111 mmol/L 106  CO2 22 - 32 mmol/L 21(L)  Calcium 8.9 - 10.3 mg/dL 8.9  Total Protein 6.5 - 8.1 g/dL 6.2(L)  Total Bilirubin 0.3 - 1.2 mg/dL 0.9  Alkaline Phos 38 - 126 U/L 135(H)  AST 15 - 41 U/L 14(L)  ALT 14 - 54 U/L 8(L)    Discharge instruction: per After Visit Summary and "Baby and Me Booklet".  After visit meds:  Allergies as of 03/30/2018   No  Known Allergies     Medication List    TAKE these medications   multivitamin-prenatal 27-0.8 MG Tabs tablet Take 1 tablet by mouth daily at 12 noon.   omeprazole 20 MG capsule Commonly known as:  PRILOSEC Take 1 capsule (20 mg total) by mouth 2 (two) times daily before a meal.       Diet: routine diet  Activity: Advance as tolerated. Pelvic rest for 6 weeks.   Outpatient follow up:4 weeks Follow up Appt: Future Appointments  Date Time Provider Department Center  04/01/2018 10:15 AM Brock Bad, MD [facility] None  04/08/2018 10:45 AM Brock Bad, MD [facility] None  04/14/2018 11:00 AM [facility] [place] 3 [facility]   Follow up Visit:No follow-ups on file.  Postpartum contraception: Interval BTL  Newborn Data: Live born female  Birth Weight: 6 lb 1.5 oz (2765 g) APGAR: 9, 9  Newborn Delivery   Birth date/time:  03/28/2018 06:55:00 Delivery type:  Vaginal, Spontaneous     Baby Feeding: Breast Disposition:rooming in   03/30/2018 Sharyon Cable, CNM
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
## 2018-03-30 NOTE — Lactation Note (Signed)
This note was copied from a baby's chart. Lactation Consultation Note  Patient Name: Elizabeth [name] [id number] Date: 03/30/2018 Reason for consult: Follow-up assessment;Early term 86-38.6wks  P3 mother whose infant is now 60 hours old.  This is an ETI at 37+2 weeks.  Mother is planning on breast/bottle feeding.  Mother had no questions/concerns.  Her breasts are soft and non tender and nipples are intact.  I provided a manual pump with instructions for use.  Engorgement prevention/treatment discussed.    Mother will continue to breast feed first and supplement with bottle after breast feeding.  She has our OP phone number for questions after discharge.  She does not have a DEBP for home use but does not desire one.  She is awaiting on baby's discharge and ready to go home.   Maternal Data Formula Feeding for Exclusion: No Has patient been taught Hand Expression?: Yes Does the patient have breastfeeding experience prior to this delivery?: Yes  Feeding    LATCH Score                   Interventions    Lactation Tools Discussed/Used WIC Program: Yes Pump Review: Setup, frequency, and cleaning;Milk Storage Initiated by:: Elizabeth [name] Date initiated:: 03/30/18   Consult Status Consult Status: Complete Date: 03/30/18 Follow-up type: Call as needed    Elizabeth [name] R Elizabeth [name] 03/30/2018, 10:20 AM

## 2018-04-01 ENCOUNTER — Encounter: Payer: Medicaid Other | Admitting: Obstetrics

## 2018-04-08 ENCOUNTER — Encounter: Payer: Medicaid Other | Admitting: Obstetrics

## 2018-04-14 ENCOUNTER — Ambulatory Visit (HOSPITAL_COMMUNITY): Payer: Medicaid Other

## 2018-04-14 ENCOUNTER — Encounter (HOSPITAL_COMMUNITY): Payer: Self-pay

## 2018-04-15 ENCOUNTER — Encounter: Payer: Self-pay | Admitting: Obstetrics and Gynecology

## 2018-04-15 DIAGNOSIS — O99013 Anemia complicating pregnancy, third trimester: Secondary | ICD-10-CM | POA: Insufficient documentation

## 2018-04-15 DIAGNOSIS — O99019 Anemia complicating pregnancy, unspecified trimester: Secondary | ICD-10-CM | POA: Insufficient documentation

## 2018-04-27 ENCOUNTER — Ambulatory Visit (INDEPENDENT_AMBULATORY_CARE_PROVIDER_SITE_OTHER): Payer: Medicaid Other | Admitting: Obstetrics

## 2018-04-27 ENCOUNTER — Other Ambulatory Visit: Payer: Self-pay

## 2018-04-27 ENCOUNTER — Encounter: Payer: Self-pay | Admitting: Obstetrics

## 2018-04-27 DIAGNOSIS — Z3202 Encounter for pregnancy test, result negative: Secondary | ICD-10-CM

## 2018-04-27 DIAGNOSIS — Z1389 Encounter for screening for other disorder: Secondary | ICD-10-CM | POA: Diagnosis not present

## 2018-04-27 DIAGNOSIS — Z3009 Encounter for other general counseling and advice on contraception: Secondary | ICD-10-CM

## 2018-04-27 DIAGNOSIS — Z30013 Encounter for initial prescription of injectable contraceptive: Secondary | ICD-10-CM

## 2018-04-27 LAB — POCT URINE PREGNANCY: Preg Test, Ur: NEGATIVE

## 2018-04-27 MED ORDER — MEDROXYPROGESTERONE ACETATE 150 MG/ML IM SUSP: 150.0000 mg | INTRAMUSCULAR | 3 refills | Status: DC

## 2018-04-27 NOTE — Progress Notes (Signed)
Wants DEPO. Subjective:     Elizabeth [name] is a 27 y.o. female who presents for a postpartum visit. She is 4 weeks postpartum following a spontaneous vaginal delivery. I have fully reviewed the prenatal and intrapartum course. The delivery was at 37.2 gestational weeks. Outcome: spontaneous vaginal delivery. Anesthesia: none. Postpartum course has been Unremarkable. Baby's course has been Unremarkable. Baby is feeding by both breast and bottle - Lucien MonsGerber Good Start. Bleeding no bleeding. Bowel function is normal. Bladder function is normal. Patient is not sexually active. Contraception method is abstinence. Postpartum depression screening: negative.  The following portions of the patient's history were reviewed and updated as appropriate: allergies, current medications, past family history, past medical history, past social history, past surgical history and problem list.  Review of Systems A comprehensive review of systems was negative.   Objective:    BP 138/86   Pulse (!) 106   Ht 5\' 4"  (1.626 m)   Wt 164 lb 4.8 oz (74.5 kg)   Breastfeeding? No   BMI 28.20 kg/m   General:  alert and no distress   Breasts:  inspection negative, no nipple discharge or bleeding, no masses or nodularity palpable  Lungs: clear to auscultation bilaterally  Heart:  regular rate and rhythm, S1, S2 normal, no murmur, click, rub or gallop  Abdomen: soft, non-tender; bowel sounds normal; no masses,  no organomegaly   Vulva:  not evaluated  Vagina: not evaluated  Cervix:  not evaluated  Corpus: not examined  Adnexa:  not evaluated  Rectal Exam: Not performed.        Assessment:     1. Postpartum care following vaginal delivery - doing well  2. Encounter for other general counseling and advice on contraception  3. Encounter for initial prescription of injectable contraceptive Rx: - POCT urine pregnancy - medroxyPROGESTERone (DEPO-PROVERA) 150 MG/ML injection; Inject 1 mL (150 mg total) into the  muscle every 3 (three) months.  Dispense: 1 mL; Refill: 3  Plan:    1. Contraception: Depo-Provera injections 2. Continue PNV's 3. Follow up in: 6 weeks for Annual.   Brock BadHARLES A. HARPER MD 04-27-2018

## 2018-04-28 ENCOUNTER — Ambulatory Visit (INDEPENDENT_AMBULATORY_CARE_PROVIDER_SITE_OTHER): Payer: Medicaid Other

## 2018-04-28 DIAGNOSIS — Z30013 Encounter for initial prescription of injectable contraceptive: Secondary | ICD-10-CM

## 2018-04-28 DIAGNOSIS — Z3042 Encounter for surveillance of injectable contraceptive: Secondary | ICD-10-CM

## 2018-04-28 MED ORDER — MEDROXYPROGESTERONE ACETATE 150 MG/ML IM SUSP
150.0000 mg | Freq: Once | INTRAMUSCULAR | Status: AC
  Administered 2018-04-28: 150 mg via INTRAMUSCULAR

## 2018-04-28 NOTE — Progress Notes (Signed)
Pt presents initial postpartum depo injection. Inj given in LUOQ. Pt tolerated well. Next depo due 2/4-2/18

## 2018-04-28 NOTE — Progress Notes (Signed)
Agree with A & P. 

## 2018-05-14 ENCOUNTER — Ambulatory Visit: Payer: Medicaid Other

## 2018-05-21 ENCOUNTER — Ambulatory Visit (INDEPENDENT_AMBULATORY_CARE_PROVIDER_SITE_OTHER): Payer: Medicaid Other | Admitting: *Deleted

## 2018-05-21 DIAGNOSIS — R399 Unspecified symptoms and signs involving the genitourinary system: Secondary | ICD-10-CM | POA: Diagnosis not present

## 2018-05-21 LAB — POCT URINALYSIS DIPSTICK
BILIRUBIN UA: NEGATIVE
Glucose, UA: NEGATIVE
KETONES UA: NEGATIVE
Leukocytes, UA: NEGATIVE
Nitrite, UA: POSITIVE
Protein, UA: POSITIVE — AB
SPEC GRAV UA: 1.015 (ref 1.010–1.025)
UROBILINOGEN UA: 0.2 U/dL
pH, UA: 6.5 (ref 5.0–8.0)

## 2018-05-21 MED ORDER — NITROFURANTOIN MONOHYD MACRO 100 MG PO CAPS: 100.0000 mg | ORAL_CAPSULE | Freq: Two times a day (BID) | ORAL | 0 refills | Status: DC

## 2018-05-23 LAB — URINE CULTURE

## 2018-05-25 ENCOUNTER — Other Ambulatory Visit: Payer: Self-pay | Admitting: Obstetrics and Gynecology

## 2018-05-25 MED ORDER — CEPHALEXIN 500 MG PO CAPS: 500.0000 mg | ORAL_CAPSULE | Freq: Four times a day (QID) | ORAL | 2 refills | Status: DC

## 2018-05-25 NOTE — Progress Notes (Signed)
Keflex sent to pharmacy

## 2018-06-08 ENCOUNTER — Ambulatory Visit: Payer: Medicaid Other | Admitting: Obstetrics

## 2018-06-08 NOTE — Progress Notes (Signed)
Patient presents for Annual Exam.  LMP: Contraception: STD Screening: Last pap:

## 2018-06-09 ENCOUNTER — Encounter: Payer: Self-pay | Admitting: Obstetrics

## 2018-06-09 NOTE — Progress Notes (Signed)
Patient rescheduled appointment.

## 2018-07-22 ENCOUNTER — Ambulatory Visit (INDEPENDENT_AMBULATORY_CARE_PROVIDER_SITE_OTHER): Payer: Medicaid Other

## 2018-07-22 DIAGNOSIS — Z3042 Encounter for surveillance of injectable contraceptive: Secondary | ICD-10-CM | POA: Diagnosis not present

## 2018-07-22 MED ORDER — MEDROXYPROGESTERONE ACETATE 150 MG/ML IM SUSP
150.0000 mg | INTRAMUSCULAR | Status: DC
  Administered 2018-07-22 – 2018-11-24 (×2): 150 mg via INTRAMUSCULAR

## 2018-07-22 NOTE — Progress Notes (Signed)
Patient is in the office for depo, administered in [place], and pt tolerated well. Next depo due 4/30-5/14. .. Administrations This Visit    medroxyPROGESTERone (DEPO-PROVERA) injection 150 mg    Admin Date 07/22/2018 Action Given Dose 150 mg Route Intramuscular Administered By Katrina Stack, RN

## 2018-07-22 NOTE — Progress Notes (Signed)
Agree with A & P. 

## 2018-10-12 ENCOUNTER — Ambulatory Visit: Payer: Medicaid Other

## 2018-10-20 ENCOUNTER — Telehealth: Payer: Self-pay

## 2018-11-09 ENCOUNTER — Telehealth: Payer: Medicaid Other | Admitting: Obstetrics

## 2018-11-10 ENCOUNTER — Other Ambulatory Visit: Payer: Self-pay

## 2018-11-10 ENCOUNTER — Ambulatory Visit (INDEPENDENT_AMBULATORY_CARE_PROVIDER_SITE_OTHER): Payer: Medicaid Other

## 2018-11-10 VITALS — BP 128/81 | HR 82 | Wt 160.3 lb

## 2018-11-10 DIAGNOSIS — Z3202 Encounter for pregnancy test, result negative: Secondary | ICD-10-CM

## 2018-11-10 LAB — POCT URINE PREGNANCY: Preg Test, Ur: NEGATIVE

## 2018-11-10 NOTE — Progress Notes (Signed)
Subjective: Presented for 1st UPT to restart DEPO.  UPT today is NEGATIVE.  Plan: Return in 2 weeks for 2nd. UPT/DEPO. Abstain from sex for the next 2 weeks.

## 2018-11-24 ENCOUNTER — Other Ambulatory Visit: Payer: Self-pay

## 2018-11-24 ENCOUNTER — Ambulatory Visit (INDEPENDENT_AMBULATORY_CARE_PROVIDER_SITE_OTHER): Payer: Medicaid Other

## 2018-11-24 DIAGNOSIS — Z3042 Encounter for surveillance of injectable contraceptive: Secondary | ICD-10-CM | POA: Diagnosis not present

## 2018-11-24 DIAGNOSIS — Z3202 Encounter for pregnancy test, result negative: Secondary | ICD-10-CM

## 2018-11-24 LAB — POCT URINE PREGNANCY: Preg Test, Ur: NEGATIVE

## 2018-11-24 NOTE — Progress Notes (Signed)
Nurse visit for 2nd UPT for Depo restart.  Last unprotected IC, >14 days ago per pt. UPT neg Next Depo due, Sept 1-15

## 2018-11-24 NOTE — Progress Notes (Signed)
Agree with A & P. 

## 2019-02-09 ENCOUNTER — Encounter: Payer: Self-pay | Admitting: Advanced Practice Midwife

## 2019-02-09 ENCOUNTER — Other Ambulatory Visit (HOSPITAL_COMMUNITY)
Admission: RE | Admit: 2019-02-09 | Discharge: 2019-02-09 | Disposition: A | Discharge status: Home / Self Care | Payer: Medicaid Other | Source: Ambulatory Visit | Attending: Advanced Practice Midwife | Admitting: Advanced Practice Midwife

## 2019-02-09 ENCOUNTER — Other Ambulatory Visit: Payer: Self-pay

## 2019-02-09 ENCOUNTER — Ambulatory Visit: Payer: Medicaid Other | Admitting: Advanced Practice Midwife

## 2019-02-09 VITALS — BP 130/79 | HR 96 | Ht 64.0 in | Wt 155.0 lb

## 2019-02-09 DIAGNOSIS — Z0000 Encounter for general adult medical examination without abnormal findings: Secondary | ICD-10-CM

## 2019-02-09 DIAGNOSIS — Z3009 Encounter for other general counseling and advice on contraception: Secondary | ICD-10-CM

## 2019-02-09 DIAGNOSIS — Z01419 Encounter for gynecological examination (general) (routine) without abnormal findings: Secondary | ICD-10-CM | POA: Insufficient documentation

## 2019-02-09 DIAGNOSIS — N921 Excessive and frequent menstruation with irregular cycle: Secondary | ICD-10-CM

## 2019-02-09 DIAGNOSIS — B9689 Other specified bacterial agents as the cause of diseases classified elsewhere: Secondary | ICD-10-CM

## 2019-02-09 DIAGNOSIS — Z113 Encounter for screening for infections with a predominantly sexual mode of transmission: Secondary | ICD-10-CM | POA: Diagnosis not present

## 2019-02-09 DIAGNOSIS — Z3042 Encounter for surveillance of injectable contraceptive: Secondary | ICD-10-CM | POA: Insufficient documentation

## 2019-02-09 NOTE — Patient Instructions (Signed)
[place] Resource Guide °(Revised August 2014) ° ° °Chronic Pain Problems:  °• [facility]:  297-2271  °         Patients need to be referred by their primary care doctor/specialist ° °Insufficient Money for Medicine:  °·         United Way: call "211"   °• MAP Program at [facility] [place] 641-8030 or [place] 641-7620 °          ° °No Primary Care Doctor:  °To locate a primary care doctor that accepts your insurance or provides certain services:  °·         [facility]: 832-8000  °·         Physician Referral Service: 1-800-533-3463 ask for “My [place]” °• If no insurance, you need to see if you qualify for GCCN “orange card”, call to set      up appointment for eligibility/enrollment at [phone] or [phone] or visit [place] [facility] ([address], [place] and [address] -[place]) to meet with a GCCN enrollment specialist. ° °Agencies that provide inexpensive (sliding fee scale) medical care:  ° °•    [facility] - [facility] [place] - [phone] °•    [facility]  -  [facility] - 336-878-6027 °•    [facility] - 336-832-7272 °•    [facility] - 336-832-4444 °•    [facility] - 336-832-3150 °•    [facility] - 336-832-8035 °• [facility] - [facility] [place] - 336-272-     1050 °• [facility] [place] - 336-370-9091 °• [facility]: 336-832-8035  °• [facility]: 336-832-4777  °• [facility]: 336-373-0678  °• [facility] - 336- 387-6161 ° ° ° °Medicaid-accepting [place] Providers:  °·         [facility] - 641-2100 (No Family Planning accepted) °·         [address], 641-2100, Mon-Fri 9am-5pm °·          [facility] - [phone] °• [address], [address], Mon-Thursday 8am-5pm, Fri 8am-noon °• [facility] - [phone] °·         [address], [address], Mon-Fri 7:30am-4:30pm °·         [facility] - 299-7000 °·         [address] [place] [address], Mon-Fri 8am-5pm  °·        [facility] - 373-1557 °·         [address] °·         Only accepts [place] Access Medicaid patients after they have their name applied to their card ° °Self Pay (no insurance) in [place]:  °         Sickle Cell Patients:  °• [address], (336) 832-1970 °[facility]: °• [address], [place] (336) 832-7272 ° °     [facility] °• [address], [place] (336) 832-4444 ° °[facility]: °• [address], (336) 832-8035 °           [facility]  °·         [address], (336) 832-4400 °[facility] °• [address], (336) [phone] °          [facility]  °·         [address], [place] [phone] °       [facility] - [address]  °·         641-2100, Mon-Fri 9am-7pm, Sat 9am-1pm °         [facility] [place] °·         [address] [place] [address], [phone] °         [facility] [place]  °·         [address], [phone] °[facility] - [facility] [place] °• [address], [place] (336) 884-0224 °         [facility]  °·         [address] [place] [address], [phone] ° [facility]  °• [address], (336) 272-1050 °[facility] °• [address], (336) 370-9091 ° Dr. Osei-Bonsu  °·         [address], [place],  [phone] °         [facility]  °·         [address], [phone] °         [facility]  °·           [address], [phone] °         [facility]  °·         [address], [phone], 1st & 3rd Saturday every month, 10am-1pm ° °OTHERS:  [facility]  ([facility] Only)  (336) [phone] (Thursday only) ° °Strategies for finding a Primary Care Provider:  °1) Find a Doctor and Pay Out of Pocket  °Although you won't have to find out who is covered by your insurance plan, it is a good idea to ask around and get recommendations. You will then need to call the office and see if the doctor you have chosen will accept you as a new patient and what types of options they offer for patients who are self-pay. Some doctors offer discounts or will set up payment plans for their patients who do not have insurance, but you will need to ask so you aren't surprised when you get to your appointment.  °2) Contact [facility] - To see if you qualify for “orange card” access to healthcare safety net providers.  Call for appointment for eligibility/enrollment at [phone] or [phone]- [phone]. (Uninsured, 0-200% FPL, qualifying info)  °Applicants for GCCN are first required to see if they are eligible to enroll in the ACA Marketplace before enrolling in GCCN (and get an exemption if they are not).  °GCCN Criteria for acceptance is:  °? Proof of ACA Marketing exemption - form or documentation  °? Valid photo ID (driver's license, state identification card, passport, home country ID)  °? Proof of [place] residency (e.g. driver’s license, lease/landlord information, pay stubs with address, utility   bill, bank statement, etc.)  °? Proof of income (1040, last year's tax return, W2, 4 current pay stubs, other income proof)  °? Proof of assets (current bank statement + 3 most recent, disability paperwork, life insurance info, tax value on autos, etc.) ° °3)  Contact Your Local Health Department  °Not all health departments have doctors that can see patients for sick visits, but many do, so it is worth a call to see if yours does. If you don't know where your local health department is, you can check in your phone book. The CDC also has a tool to help you locate your state's health department, and many state websites also have listings of all of their local health departments.  °4) Find a Walk-in Clinic  °If your illness is not likely to be very severe or complicated, you may want to try a walk in clinic. These are popping up all over the country in pharmacies, drugstores, and shopping centers. They're usually staffed by nurse practitioners or physician assistants that have been trained to treat common illnesses and complaints. They're usually fairly quick and inexpensive. However, if you have serious medical issues or chronic medical problems, these are probably not your best option  ° °STD Testing:  °·         [place] [facility] [place], STD Clinic  °·         [address], [place], phone [phone] or 1-877-539-9860  °·         Monday - Friday, call for an appointment °·         [facility] [place], STD Clinic  °·         [address], [place], phone [phone] or 1-877-539-9860  °·         Monday - Friday, call for an appointment °Abuse/Neglect:  °·         [place] Child Abuse Hotline: 641-3795  °·         [place] Child Abuse Hotline: 800-378-5315 (After Hours) ° °Emergency Shelter:  ° [facility] (336) 271-5959 ° [facility] [place]- (336) 881-5415 ° [facility] [place] - (336) 235-0330 ° [facility] - (336) 375-1332 (ages 11-17) ° Homeless Day Shelter @ [facility] - (336) 332-0824 °  °Mammograms - Free at [facility] - [place] - 614-3233 ° °Maternity Homes:  °·         [facility]: (336) 275-9566   (Homeless mother with  children) °·         [facility]: (704) 372-4663 (Mothers only) °• [facility]: (336) 370-9232 (Pregnant 16-21 years old) °• [facility] -(336) 641-7777 ° °[place] [facility]  °• [facility] °• [address], [place] (336) 355-9701 °·         [facility] [place]  °·         [address], [place]  °·         349-3220 °·         [facility]  °·         [address], [place]  °·         342-7768 °·         [facility] [facility]  °·         [address], [place]  °·         342-8140 °·         [facility]  °·         342-8316 °·         [place] [facility] - [facility]  °·         1-888-581-9988 °·         [facility] [place]  °·         [address]  °·         336-349-4454, Insurance °·         [place] Child Abuse Hotline  °·         (336) 342-1394  °·         (336) 342-3537 (After Hours) ° °Behavioral Health Services /Substance Abuse Resources:  °·         [facility]: 882-2125  °·         [facility]: 784-9470  °·        [facility]: (336) 285-9073  °• Narcotics Helpline - 1-866-375-1272 °·         [facility]: (336) 845-3988  °·         [facility]idential [facility] Outpatient Substance Abuse [facility] - [facility]: 800-659-3381 °• [facility] - (336) 285-2605 °Psychological Services: °·         [facility]: 832-9600  °• [facility]: 1-877-626-1772 °·         [facility]  °·         [address] [place] [address], [place]  °·         ACCESS LINE: 1-800-256-2452     (24 Hour) °• Mobile Crisis:  °• HELPLINES:  °[facility] [place] (336) 370-4264 °[facility] -  (800) 451-9682 °• Walk In Crisis Services °      [facility] [address] [place] [address] - [place]  (336)676-6905 °       [facility] - (336)832-9600 or (336) 832-9700 ° [facility] - [address] - [place] (336)899-1505 ° [facility] [place] [facility] - [address], [place] (336) 878-6098 ° ° °Dental Assistance:  °If unable to pay or uninsured, contact: [place] [facility] to become qualified for the adult dental clinic. Patient must be enrolled in GCCN (uninsured, 0-200% FPL, qualifying info).  Enroll in GCCN first, then see Primary Care Physician assigned to you, the PCP makes a dental referral. [facility] will receive referral and contacts patient for appointment. ° °Patients with Medicaid  °         [address], 510-2600 ° [facility]Children up to 20 + Pregnant Women) - (336) 641-3152 ° [facility] - [address] - [address] (336) 235-2808 ° °If unable to pay, or uninsured: contact [place] [facility] (641-3152 in  - (Children only + Pregnant Women), 641-7733 in [place]- Children only) to become qualified for the adult dental clinic  °Must see if eligible to enroll in ACA Health Insurance Marketplace before enrolling into the GCCN (exemption required) (1-855-733-3711 for an appointment)  www.healthcare.gov;   1-800-318-2596.  If not eligible for ACA, then go by Department of Health and Human Services to see if eligible for “orange card.”    [address], [place] and [address]- [place].  Once you get an orange card, you will have a Primary Care home who will then refer you to dental if needed. ° ° ° ° ° ° ° °Other Low-Cost Community Dental Services:  ° [facility] - 334-4822 (ext 50251) °  [address] [place] [address] ° Dr. Civils - 272-4177 °  [address] °  ° [facility] - 734-7550 °  [address] ° °         [facility]  °         [address], [place], [place], [address]  °         723-1848, Ext. 123  °         2nd and 4th Thursday of the month at 6:30am (Simple  extractions only - no wisdom teeth or surgery) First come/First serve -First 10 clients served ° °         [facility] ([place] and [place] residents only) °         [address], [place], [place], [address]  °         723-7904 °          °         [facility]  °         342-8273 °         [place] [facility]  °        703-3100 °        [facility] °         570-6415 °  °Transportation Options: ° Ambulance - 911 - $250-$700 per ride °Family Member to accompany patient (if stable) -  [employer] - (336) 355-6499 ° PART - (336) 662-0002 ° Taxi - (336) 272-5112 - [place] ° [facility] - (336) 333-6589 (Application required) ° [place] [facility] - (336) 641-4848 °  ° °

## 2019-02-09 NOTE — Progress Notes (Signed)
Patient presents for Annual Exam today.  LMP:no periods w/ Depo pt has noted spotted.  Contraception: Depo  STD Screening: Desires Full panel  Last pap: In [place] 2019  WNL  Family Hx of Breast Cancer: None per pt    CC: Spotting w/Depo. Considering BTL   Pt consents to Female student/resident in exam room

## 2019-02-09 NOTE — Progress Notes (Signed)
Subjective:     Elizabeth [name] is a 28 y.o. female here for a routine exam.  Current complaints: some spotting with Depo, last spotting 2 weeks ago.  Personal health questionnaire reviewed: yes.   Do you have a primary care provider? No,  How many times per week do you exercise? unknown Do you feel safe at home? yes Has anyone hit, slapped, or kicked you recently? no Do you feel sad, tired, or upset most days or are you mostly happy with life? Mostly happy  Gynecologic History No LMP recorded. Patient has had an injection. Contraception: Depo-Provera injections Last Pap: ?Marland Kitchen Results were: normal Last mammogram: n/a.  Obstetric History OB History  Gravida Para Term Preterm AB Living  5 3 3  0 2 3  SAB TAB Ectopic Multiple Live Births  0 1 1 0 3    # Outcome Date GA Lbr Len/2nd Weight Sex Delivery Anes PTL Lv  5 Term 03/28/18 [redacted]w[redacted]d 04:50 / 00:05 6 lb 1.5 oz (2.765 kg) M Vag-Spont None  LIV  4 Term 11/21/15 [redacted]w[redacted]d 01:49 / 00:07 6 lb 15.6 oz (3.165 kg) M Vag-Spont None  LIV  3 Ectopic 2016          2 Term 04/21/12 [redacted]w[redacted]d 13:09 / 00:16 6 lb (2.721 kg) F Vag-Spont EPI  LIV  1 TAB              The following portions of the patient's history were reviewed and updated as appropriate: allergies, current medications, past family history, past medical history, past social history, past surgical history and problem list.  Review of Systems Pertinent items noted in HPI and remainder of comprehensive ROS otherwise negative.    Objective:   BP 130/79   Pulse 96   Ht 5\' 4"  (1.626 m)   Wt 155 lb (70.3 kg)   BMI 26.61 kg/m   VS reviewed, nursing note reviewed,  Constitutional: well developed, well nourished, no distress HEENT: normocephalic CV: normal rate Pulm/chest wall: normal effort Breast Exam:  right breast normal without mass, skin or nipple changes or axillary nodes, left breast normal without mass, skin or nipple changes or axillary nodes Abdomen: soft Neuro: alert and  oriented x 3 Skin: warm, dry Psych: affect normal Pelvic exam: Cervix pink, visually closed, without lesion, scant white creamy discharge, vaginal walls and external genitalia normal Bimanual exam: Cervix 0/long/high, firm, anterior, neg CMT, uterus nontender, nonenlarged, adnexa without tenderness, enlargement, or mass       Assessment/Plan:   1. Screening for STD (sexually transmitted disease)  - Cervicovaginal ancillary only( [place]) - Hepatitis B surface antigen - Hepatitis C antibody - RPR - HIV Antibody (routine testing w rflx)  2. Well woman exam with routine gynecological exam --Doing well, likes Depo but usually no menses, had one episode of spotting 2 weeks ago - Cytology - PAP( [place])  3. Breakthrough bleeding on depo provera --x1 episode, resolved  4. Encounter for counseling regarding contraception --Discussed LARCs as most effective forms of birth control.  Discussed benefits/risks of other methods.  Pt to continue Depo for now but printed materials about IUD given, pt to consider.   --Pt discussed BTL, which is an option but she is 28 yo, so recommend considering IUD and delay permanent sterilization.  Pt considering and discussing with her s/o. --Depo injection due 02/17/19.     Follow up as scheduled for Depo injections and for annual exams or as needed.   Fatima Blank, CNM 10:52  AM

## 2019-02-10 LAB — CYTOLOGY - PAP: Diagnosis: NEGATIVE

## 2019-02-10 LAB — RPR: RPR Ser Ql: NONREACTIVE

## 2019-02-10 LAB — HEPATITIS C ANTIBODY: Hep C Virus Ab: <0.1 s/co ratio (ref 0.0–0.9)

## 2019-02-10 LAB — HIV ANTIBODY (ROUTINE TESTING W REFLEX): HIV Screen 4th Generation wRfx: NONREACTIVE

## 2019-02-10 LAB — HEPATITIS B SURFACE ANTIGEN: Hepatitis B Surface Ag: NEGATIVE

## 2019-02-11 LAB — CERVICOVAGINAL ANCILLARY ONLY
Bacterial vaginitis: POSITIVE — AB
Candida vaginitis: NEGATIVE
Chlamydia: NEGATIVE
Neisseria Gonorrhea: NEGATIVE
Trichomonas: NEGATIVE

## 2019-02-11 MED ORDER — METRONIDAZOLE 500 MG PO TABS: 500.0000 mg | ORAL_TABLET | Freq: Two times a day (BID) | ORAL | 0 refills | Status: AC

## 2019-02-11 NOTE — Addendum Note (Signed)
Addended by: Fatima Blank A on: 02/11/2019 12:21 PM   Modules accepted: Orders

## 2019-02-17 ENCOUNTER — Ambulatory Visit: Payer: Medicaid Other

## 2019-02-24 ENCOUNTER — Telehealth: Payer: Self-pay | Admitting: Obstetrics

## 2019-09-13 ENCOUNTER — Ambulatory Visit (HOSPITAL_COMMUNITY)
Admission: EM | Admit: 2019-09-13 | Discharge: 2019-09-13 | Disposition: A | Discharge status: Home / Self Care | Payer: Medicaid Other | Attending: Physician Assistant | Admitting: Physician Assistant

## 2019-09-13 ENCOUNTER — Other Ambulatory Visit: Payer: Self-pay

## 2019-09-13 ENCOUNTER — Encounter (HOSPITAL_COMMUNITY): Payer: Self-pay

## 2019-09-13 DIAGNOSIS — B9689 Other specified bacterial agents as the cause of diseases classified elsewhere: Secondary | ICD-10-CM | POA: Diagnosis not present

## 2019-09-13 DIAGNOSIS — N760 Acute vaginitis: Secondary | ICD-10-CM

## 2019-09-13 DIAGNOSIS — Z3202 Encounter for pregnancy test, result negative: Secondary | ICD-10-CM

## 2019-09-13 LAB — POCT PREGNANCY, URINE: Preg Test, Ur: NEGATIVE

## 2019-09-13 LAB — POCT URINALYSIS DIP (DEVICE)
Bilirubin Urine: NEGATIVE
Glucose, UA: NEGATIVE mg/dL
Ketones, ur: NEGATIVE mg/dL
Leukocytes,Ua: NEGATIVE
Nitrite: NEGATIVE
Protein, ur: NEGATIVE mg/dL
Specific Gravity, Urine: >=1.03 (ref 1.005–1.030)
Urobilinogen, UA: 0.2 mg/dL (ref 0.0–1.0)
pH: 6.5 (ref 5.0–8.0)

## 2019-09-13 LAB — POC URINE PREG, ED: Preg Test, Ur: NEGATIVE

## 2019-09-13 MED ORDER — FLUCONAZOLE 150 MG PO TABS: 150.0000 mg | ORAL_TABLET | Freq: Every day | ORAL | 0 refills | Status: DC

## 2019-09-13 MED ORDER — METRONIDAZOLE 500 MG PO TABS: 500.0000 mg | ORAL_TABLET | Freq: Two times a day (BID) | ORAL | 0 refills | Status: DC

## 2019-09-13 NOTE — Discharge Instructions (Signed)
We have sent your swab and will notify you of any necessary changes in your treatment.  We will only call with positive results otherwise you will build to find results in your MyChart  I believe this is likely bacterial vaginosis and I would like for you to take the metronidazole for 7 days.  Given the early signs this may also represent a yeast infection.  So would like for you to take 1 Diflucan today and then 1 after you finish your metronidazole   If your abdominal pain becomes more severe you develop fever and chills please return.

## 2019-09-13 NOTE — ED Triage Notes (Signed)
Pt c/o 3/10 left lower pelvic pain, whitish, odorous vaginal dischargex2 wks. Pt states menstrual only lasted 3 days, but usually lasts 7 days even though her menstruals are irregular.

## 2019-09-13 NOTE — ED Provider Notes (Signed)
[facility]    CSN: [id number] Arrival date & time: 09/13/19  1129      History   Chief Complaint Chief Complaint  Patient presents with  . Vaginal Discharge    HPI Elizabeth [name] is a 29 y.o. female.   Patient reports for evaluation of 2-week history of lower left-sided abdominal cramping as well as development of vaginal discharge.  She reports 2 weeks ago she noticed "period Like cramping" in her left lower abdomen.  She reports this pain is 3/10.  She reports it has not gotten better or worse.  She reports 1 week ago she noticed thick white discharge as well as thin gray-white discharge.  There has not been a lot of discharge and has not increased in amount since that time.  She does report a fish-like odor.  She denies any vaginal pain, irritation or itching.  She denies any pain with sex.  She does report that her last menstrual cycle was irregular for her and only lasted 3 days.  She reports she is sexually active with her boyfriend and they recently had a condom break during sex.  She is not utilizing any contraception and took a pregnancy test 1 week ago which was negative.  She denies any fever, chills.  Denies painful urination, frequency or urgency.  She reports a remote history of trichomonas infection.  She also reports a history of ectopic pregnancy several years ago.        Past Medical History:  Diagnosis Date  . Medical history non-contributory   . No pertinent past medical history    hx ectopic pregnancy    Patient Active Problem List   Diagnosis Date Noted  . Depo-Provera contraceptive status 02/09/2019    Past Surgical History:  Procedure Laterality Date  . THERAPEUTIC ABORTION      OB History    Gravida  5   Para  3   Term  3   Preterm  0   AB  2   Living  3     SAB  0   TAB  1   Ectopic  1   Multiple  0   Live Births  3            Home Medications    Prior to Admission medications   Medication Sig  Start Date End Date Taking? Authorizing Provider  folic acid (FOLVITE) 1 MG tablet Take 1 mg by mouth daily.   Yes [provider]  vitamin B-12 (CYANOCOBALAMIN) 250 MCG tablet Take 250 mcg by mouth daily.   Yes [provider]  cephALEXin (KEFLEX) 500 MG capsule Take 1 capsule (500 mg total) by mouth 4 (four) times daily. Patient not taking: Reported on 11/10/2018 05/25/18   Sloan Leiter, MD  fluconazole (DIFLUCAN) 150 MG tablet Take 1 tablet (150 mg total) by mouth daily. 09/13/19   Broderick Fonseca, Marguerita Beards, PA-C  medroxyPROGESTERone (DEPO-PROVERA) 150 MG/ML injection Inject 1 mL (150 mg total) into the muscle every 3 (three) months. 04/27/18   Shelly Bombard, MD  metroNIDAZOLE (FLAGYL) 500 MG tablet Take 1 tablet (500 mg total) by mouth 2 (two) times daily. 09/13/19   Sharran Caratachea, Marguerita Beards, PA-C  nitrofurantoin, macrocrystal-monohydrate, (MACROBID) 100 MG capsule Take 1 capsule (100 mg total) by mouth 2 (two) times daily. 1 po BID x 7days Patient not taking: Reported on 11/10/2018 05/21/18   Sloan Leiter, MD  omeprazole (PRILOSEC) 20 MG capsule Take 1 capsule (20 mg  total) by mouth 2 (two) times daily before a meal. Patient not taking: Reported on 05/21/2018 03/18/18   Brock Bad, MD  Prenatal Vit-Fe Fumarate-FA (MULTIVITAMIN-PRENATAL) 27-0.8 MG TABS tablet Take 1 tablet by mouth daily at 12 noon.    [provider]    Family History Family History  Problem Relation Age of Onset  . Hearing loss Mother   . Thyroid disease Mother   . Anesthesia problems Neg Hx     Social History Social History   Tobacco Use  . Smoking status: Former Smoker    Types: Cigarettes    Quit date: 06/02/2014    Years since quitting: 5.2  . Smokeless tobacco: Never Used  Substance Use Topics  . Alcohol use: Yes    Comment: socially  . Drug use: No     Allergies   Patient has no known allergies.   Review of Systems Review of Systems  Constitutional: Negative for chills and fever.   Gastrointestinal: Positive for abdominal pain.  Genitourinary: Positive for pelvic pain and vaginal discharge. Negative for dysuria, frequency, urgency and vaginal pain.     Physical Exam Triage Vital Signs ED Triage Vitals [09/13/19 1252]  Enc Vitals Group     BP 126/80     Pulse Rate 79     Resp 16     Temp 98.6 F (37 C)     Temp Source Oral     SpO2 100 %     Weight 130 lb (59 kg)     Height 5\' 4"  (1.626 m)     Head Circumference      Peak Flow      Pain Score 3     Pain Loc      Pain Edu?      Excl. in GC?    No data found.  Updated Vital Signs BP 126/80   Pulse 79   Temp 98.6 F (37 C) (Oral)   Resp 16   Ht 5\' 4"  (1.626 m)   Wt 130 lb (59 kg)   SpO2 100%   BMI 22.31 kg/m   Visual Acuity Right Eye Distance:   Left Eye Distance:   Bilateral Distance:    Right Eye Near:   Left Eye Near:    Bilateral Near:     Physical Exam Vitals and nursing note reviewed.  Constitutional:      General: She is not in acute distress.    Appearance: She is well-developed. She is not ill-appearing.  HENT:     Head: Normocephalic and atraumatic.  Eyes:     Conjunctiva/sclera: Conjunctivae normal.  Cardiovascular:     Rate and Rhythm: Normal rate and regular rhythm.     Heart sounds: No murmur.  Pulmonary:     Effort: Pulmonary effort is normal. No respiratory distress.     Breath sounds: Normal breath sounds.  Abdominal:     Palpations: Abdomen is soft.     Tenderness: There is abdominal tenderness (mild lower abdominal tenderness).  Genitourinary:    Comments: External genitals without lesion.  Speculum exam: Vaginal walls without erythema or irritation.  Scant thin bloody discharge in vaginal vault.  Cervix without friability or irritation or lesion there is similar scant and bloody discharge in the cervical fornix.    No adnexal masses palpated or cervical motion tenderness. Musculoskeletal:     Cervical back: Neck supple.  Skin:    General: Skin is warm  and dry.  Neurological:  Mental Status: She is alert.      UC Treatments / Results  Labs (all labs ordered are listed, but only abnormal results are displayed) Labs Reviewed  POCT URINALYSIS DIP (DEVICE) - Abnormal; Notable for the following components:      Result Value   Hgb urine dipstick MODERATE (*)    All other components within normal limits  POC URINE PREG, ED  POCT PREGNANCY, URINE  CERVICOVAGINAL ANCILLARY ONLY    EKG   Radiology No results found.  Procedures Procedures (including critical care time)  Medications Ordered in UC Medications - No data to display  Initial Impression / Assessment and Plan / UC Course  I have reviewed the triage vital signs and the nursing notes.  Pertinent labs & imaging results that were available during my care of the patient were reviewed by me and considered in my medical decision making (see chart for details).     #Bacterial vaginosis Patient 29 year old presenting with vaginal discharge complains of irritation.  Patient is to have moderate blood on UA however she also has current vaginal bleeding.  Pregnancy was negative.  Exam was mostly consistent with bacterial vaginosis.  Given minimal discomfort with exam and no cervical motion tenderness very low suspicion for PID.  Swab for gonorrhea, chlamydia, trichomonas was sent.  Will initiate metronidazole treatment.  Patient to take Diflucan at the beginning and end of this treatment. -Return precautions were discussed  Final Clinical Impressions(s) / UC Diagnoses   Final diagnoses:  Bacterial vaginosis     Discharge Instructions     We have sent your swab and will notify you of any necessary changes in your treatment.  We will only call with positive results otherwise you will build to find results in your MyChart  I believe this is likely bacterial vaginosis and I would like for you to take the metronidazole for 7 days.  Given the early signs this may also represent  a yeast infection.  So would like for you to take 1 Diflucan today and then 1 after you finish your metronidazole   If your abdominal pain becomes more severe you develop fever and chills please return.      ED Prescriptions    Medication Sig Dispense Auth. Provider   metroNIDAZOLE (FLAGYL) 500 MG tablet Take 1 tablet (500 mg total) by mouth 2 (two) times daily. 14 tablet Aymee Fomby, Veryl Speak, PA-C   fluconazole (DIFLUCAN) 150 MG tablet Take 1 tablet (150 mg total) by mouth daily. 2 tablet Daruis Swaim, Veryl Speak, PA-C     PDMP not reviewed this encounter.   Hermelinda Medicus, PA-C 09/14/19 [phone]

## 2019-09-14 LAB — CERVICOVAGINAL ANCILLARY ONLY
Bacterial Vaginitis (gardnerella): POSITIVE — AB
Candida Glabrata: NEGATIVE
Candida Vaginitis: NEGATIVE
Chlamydia: NEGATIVE
Comment: NEGATIVE
Comment: NEGATIVE
Comment: NEGATIVE
Comment: NEGATIVE
Comment: NEGATIVE
Comment: NORMAL
Neisseria Gonorrhea: NEGATIVE
Trichomonas: NEGATIVE

## 2019-09-24 ENCOUNTER — Other Ambulatory Visit: Payer: Self-pay

## 2019-09-24 ENCOUNTER — Ambulatory Visit (INDEPENDENT_AMBULATORY_CARE_PROVIDER_SITE_OTHER): Payer: Medicaid Other | Admitting: Obstetrics and Gynecology

## 2019-09-24 ENCOUNTER — Encounter: Payer: Self-pay | Admitting: Obstetrics and Gynecology

## 2019-09-24 VITALS — BP 124/85 | HR 89 | Ht 64.0 in | Wt 138.0 lb

## 2019-09-24 DIAGNOSIS — Z309 Encounter for contraceptive management, unspecified: Secondary | ICD-10-CM

## 2019-09-24 DIAGNOSIS — Z3202 Encounter for pregnancy test, result negative: Secondary | ICD-10-CM

## 2019-09-24 LAB — POCT URINE PREGNANCY: Preg Test, Ur: NEGATIVE

## 2019-09-24 NOTE — Patient Instructions (Signed)
Levonorgestrel intrauterine device (IUD) What is this medicine? LEVONORGESTREL IUD (LEE voe nor jes trel) is a contraceptive (birth control) device. The device is placed inside the uterus by a healthcare professional. It is used to prevent pregnancy. This device can also be used to treat heavy bleeding that occurs during your period. This medicine may be used for other purposes; ask your health care provider or pharmacist if you have questions. COMMON BRAND NAME(S): Kyleena, LILETTA, Mirena, Skyla What should I tell my health care provider before I take this medicine? They need to know if you have any of these conditions:  abnormal Pap smear  cancer of the breast, uterus, or cervix  diabetes  endometritis  genital or pelvic infection now or in the past  have more than one sexual partner or your partner has more than one partner  heart disease  history of an ectopic or tubal pregnancy  immune system problems  IUD in place  liver disease or tumor  problems with blood clots or take blood-thinners  seizures  use intravenous drugs  uterus of unusual shape  vaginal bleeding that has not been explained  an unusual or allergic reaction to levonorgestrel, other hormones, silicone, or polyethylene, medicines, foods, dyes, or preservatives  pregnant or trying to get pregnant  breast-feeding How should I use this medicine? This device is placed inside the uterus by a health care professional. Talk to your pediatrician regarding the use of this medicine in children. Special care may be needed. Overdosage: If you think you have taken too much of this medicine contact a poison control center or emergency room at once. NOTE: This medicine is only for you. Do not share this medicine with others. What if I miss a dose? This does not apply. Depending on the brand of device you have inserted, the device will need to be replaced every 3 to 6 years if you wish to continue using this type  of birth control. What may interact with this medicine? Do not take this medicine with any of the following medications:  amprenavir  bosentan  fosamprenavir This medicine may also interact with the following medications:  aprepitant  armodafinil  barbiturate medicines for inducing sleep or treating seizures  bexarotene  boceprevir  griseofulvin  medicines to treat seizures like carbamazepine, ethotoin, felbamate, oxcarbazepine, phenytoin, topiramate  modafinil  pioglitazone  rifabutin  rifampin  rifapentine  some medicines to treat HIV infection like atazanavir, efavirenz, indinavir, lopinavir, nelfinavir, tipranavir, ritonavir  St. John's wort  warfarin This list may not describe all possible interactions. Give your health care provider a list of all the medicines, herbs, non-prescription drugs, or dietary supplements you use. Also tell them if you smoke, drink alcohol, or use illegal drugs. Some items may interact with your medicine. What should I watch for while using this medicine? Visit your doctor or health care professional for regular check ups. See your doctor if you or your partner has sexual contact with others, becomes HIV positive, or gets a sexual transmitted disease. This product does not protect you against HIV infection (AIDS) or other sexually transmitted diseases. You can check the placement of the IUD yourself by reaching up to the top of your vagina with clean fingers to feel the threads. Do not pull on the threads. It is a good habit to check placement after each menstrual period. Call your doctor right away if you feel more of the IUD than just the threads or if you cannot feel the threads at   all. The IUD may come out by itself. You may become pregnant if the device comes out. If you notice that the IUD has come out use a backup birth control method like condoms and call your health care provider. Using tampons will not change the position of the  IUD and are okay to use during your period. This IUD can be safely scanned with magnetic resonance imaging (MRI) only under specific conditions. Before you have an MRI, tell your healthcare provider that you have an IUD in place, and which type of IUD you have in place. What side effects may I notice from receiving this medicine? Side effects that you should report to your doctor or health care professional as soon as possible:  allergic reactions like skin rash, itching or hives, swelling of the face, lips, or tongue  fever, flu-like symptoms  genital sores  high blood pressure  no menstrual period for 6 weeks during use  pain, swelling, warmth in the leg  pelvic pain or tenderness  severe or sudden headache  signs of pregnancy  stomach cramping  sudden shortness of breath  trouble with balance, talking, or walking  unusual vaginal bleeding, discharge  yellowing of the eyes or skin Side effects that usually do not require medical attention (report to your doctor or health care professional if they continue or are bothersome):  acne  breast pain  change in sex drive or performance  changes in weight  cramping, dizziness, or faintness while the device is being inserted  headache  irregular menstrual bleeding within first 3 to 6 months of use  nausea This list may not describe all possible side effects. Call your doctor for medical advice about side effects. You may report side effects to FDA at 1-800-FDA-1088. Where should I keep my medicine? This does not apply. NOTE: This sheet is a summary. It may not cover all possible information. If you have questions about this medicine, talk to your doctor, pharmacist, or health care provider.  2020 Elsevier/Gold Standard (2018-04-07 13:22:01)  

## 2019-09-24 NOTE — Progress Notes (Signed)
29 yo P3 here for contraception counseling. Patient previously using depo-provera and condoms but desires to change to a LARC. Patient reports currently only using condoms. She has a regular monthly period. She was recently tested for STI. She admits to recent break in condoms and using plan B. Patient desires information on Nexplanon and IUD and plans to return for insertion.   Past Medical History:  Diagnosis Date  . Medical history non-contributory   . No pertinent past medical history    hx ectopic pregnancy   Past Surgical History:  Procedure Laterality Date  . THERAPEUTIC ABORTION     Family History  Problem Relation Age of Onset  . Hearing loss Mother   . Thyroid disease Mother   . Anesthesia problems Neg Hx    Social History   Tobacco Use  . Smoking status: Former Smoker    Types: Cigarettes    Quit date: 06/02/2014    Years since quitting: 5.3  . Smokeless tobacco: Never Used  Substance Use Topics  . Alcohol use: Yes    Comment: socially  . Drug use: No   ROS See pertinent in HPI. All other systems reviewed and explained  Blood pressure 124/85, pulse 89, height 5\' 4"  (1.626 m), weight 138 lb (62.6 kg), last menstrual period 09/13/2019, not currently breastfeeding. GENERAL: Well-developed, well-nourished female in no acute distress.  NEURO: alert and oriented x 3 EXTREMITIES: No cyanosis, clubbing, or edema, 2+ distal pulses.  A/P 29 yo here for contraception counseling - Following counseling, patient opted to return for IUD insertion - Information provided - Patient advised to abstain or use condoms until next appointment

## 2019-09-24 NOTE — Progress Notes (Signed)
RGYN present for consult for contraception. Pt states she went to Urgent care for STD testing.    End of March early April (first week) Pt notes condom had broke during intercourse pt took Plan B the same day.   Last Pap: 02/09/2019  UPT : NEGATIVE

## 2019-10-01 ENCOUNTER — Other Ambulatory Visit: Payer: Self-pay

## 2019-10-01 ENCOUNTER — Ambulatory Visit: Payer: Medicaid Other | Admitting: Obstetrics & Gynecology

## 2019-10-01 ENCOUNTER — Encounter: Payer: Self-pay | Admitting: Obstetrics & Gynecology

## 2019-10-01 VITALS — BP 97/63 | HR 89 | Wt 138.6 lb

## 2019-10-01 DIAGNOSIS — Z3202 Encounter for pregnancy test, result negative: Secondary | ICD-10-CM

## 2019-10-01 DIAGNOSIS — Z30017 Encounter for initial prescription of implantable subdermal contraceptive: Secondary | ICD-10-CM

## 2019-10-01 DIAGNOSIS — Z30430 Encounter for insertion of intrauterine contraceptive device: Secondary | ICD-10-CM | POA: Diagnosis not present

## 2019-10-01 DIAGNOSIS — Z975 Presence of (intrauterine) contraceptive device: Secondary | ICD-10-CM

## 2019-10-01 LAB — POCT URINE PREGNANCY: Preg Test, Ur: NEGATIVE

## 2019-10-01 MED ORDER — LEVONORGESTREL 19.5 MCG/DAY IU IUD: INTRAUTERINE_SYSTEM | Freq: Once | INTRAUTERINE | Status: AC

## 2019-10-01 NOTE — Progress Notes (Signed)
    GYNECOLOGY OFFICE PROCEDURE NOTE  Elizabeth [name] is a 29 y.o. [phone] here for Liletta IUD insertion. No GYN concerns.  Last pap smear was on 02/2019 and was normal.  IUD Insertion Procedure Note Patient identified, informed consent performed, consent signed.   Discussed risks of irregular bleeding, cramping, infection, malpositioning or misplacement of the IUD outside the uterus which may require further procedure such as laparoscopy. Time out was performed.  Urine pregnancy test negative.  Speculum placed in the vagina.  Cervix visualized.  Cleaned with Betadine x 2.  Grasped anteriorly with a single tooth tenaculum.  Uterus sounded to 8 cm.  Liletta IUD placed per manufacturer's recommendations.  Strings trimmed to 3 cm. Tenaculum was removed, good hemostasis noted.  Patient tolerated procedure well.   Patient was given post-procedure instructions.  She was advised to have backup contraception for one week.  Patient was also asked to check IUD strings periodically and follow up in 4 weeks for IUD check.   Scheryl Darter, MD, FACOG Attending Obstetrician & Gynecologist, [facility] for [facility], [facility]

## 2019-10-01 NOTE — Patient Instructions (Signed)
Levonorgestrel intrauterine device (IUD) What is this medicine? LEVONORGESTREL IUD (LEE voe nor jes trel) is a contraceptive (birth control) device. The device is placed inside the uterus by a healthcare professional. It is used to prevent pregnancy. This device can also be used to treat heavy bleeding that occurs during your period. This medicine may be used for other purposes; ask your health care provider or pharmacist if you have questions. COMMON BRAND NAME(S): Kyleena, LILETTA, Mirena, Skyla What should I tell my health care provider before I take this medicine? They need to know if you have any of these conditions:  abnormal Pap smear  cancer of the breast, uterus, or cervix  diabetes  endometritis  genital or pelvic infection now or in the past  have more than one sexual partner or your partner has more than one partner  heart disease  history of an ectopic or tubal pregnancy  immune system problems  IUD in place  liver disease or tumor  problems with blood clots or take blood-thinners  seizures  use intravenous drugs  uterus of unusual shape  vaginal bleeding that has not been explained  an unusual or allergic reaction to levonorgestrel, other hormones, silicone, or polyethylene, medicines, foods, dyes, or preservatives  pregnant or trying to get pregnant  breast-feeding How should I use this medicine? This device is placed inside the uterus by a health care professional. Talk to your pediatrician regarding the use of this medicine in children. Special care may be needed. Overdosage: If you think you have taken too much of this medicine contact a poison control center or emergency room at once. NOTE: This medicine is only for you. Do not share this medicine with others. What if I miss a dose? This does not apply. Depending on the brand of device you have inserted, the device will need to be replaced every 3 to 6 years if you wish to continue using this type  of birth control. What may interact with this medicine? Do not take this medicine with any of the following medications:  amprenavir  bosentan  fosamprenavir This medicine may also interact with the following medications:  aprepitant  armodafinil  barbiturate medicines for inducing sleep or treating seizures  bexarotene  boceprevir  griseofulvin  medicines to treat seizures like carbamazepine, ethotoin, felbamate, oxcarbazepine, phenytoin, topiramate  modafinil  pioglitazone  rifabutin  rifampin  rifapentine  some medicines to treat HIV infection like atazanavir, efavirenz, indinavir, lopinavir, nelfinavir, tipranavir, ritonavir  St. John's wort  warfarin This list may not describe all possible interactions. Give your health care provider a list of all the medicines, herbs, non-prescription drugs, or dietary supplements you use. Also tell them if you smoke, drink alcohol, or use illegal drugs. Some items may interact with your medicine. What should I watch for while using this medicine? Visit your doctor or health care professional for regular check ups. See your doctor if you or your partner has sexual contact with others, becomes HIV positive, or gets a sexual transmitted disease. This product does not protect you against HIV infection (AIDS) or other sexually transmitted diseases. You can check the placement of the IUD yourself by reaching up to the top of your vagina with clean fingers to feel the threads. Do not pull on the threads. It is a good habit to check placement after each menstrual period. Call your doctor right away if you feel more of the IUD than just the threads or if you cannot feel the threads at   all. The IUD may come out by itself. You may become pregnant if the device comes out. If you notice that the IUD has come out use a backup birth control method like condoms and call your health care provider. Using tampons will not change the position of the  IUD and are okay to use during your period. This IUD can be safely scanned with magnetic resonance imaging (MRI) only under specific conditions. Before you have an MRI, tell your healthcare provider that you have an IUD in place, and which type of IUD you have in place. What side effects may I notice from receiving this medicine? Side effects that you should report to your doctor or health care professional as soon as possible:  allergic reactions like skin rash, itching or hives, swelling of the face, lips, or tongue  fever, flu-like symptoms  genital sores  high blood pressure  no menstrual period for 6 weeks during use  pain, swelling, warmth in the leg  pelvic pain or tenderness  severe or sudden headache  signs of pregnancy  stomach cramping  sudden shortness of breath  trouble with balance, talking, or walking  unusual vaginal bleeding, discharge  yellowing of the eyes or skin Side effects that usually do not require medical attention (report to your doctor or health care professional if they continue or are bothersome):  acne  breast pain  change in sex drive or performance  changes in weight  cramping, dizziness, or faintness while the device is being inserted  headache  irregular menstrual bleeding within first 3 to 6 months of use  nausea This list may not describe all possible side effects. Call your doctor for medical advice about side effects. You may report side effects to FDA at 1-800-FDA-1088. Where should I keep my medicine? This does not apply. NOTE: This sheet is a summary. It may not cover all possible information. If you have questions about this medicine, talk to your doctor, pharmacist, or health care provider.  2020 Elsevier/Gold Standard (2018-04-07 13:22:01)  

## 2019-10-01 NOTE — Addendum Note (Signed)
Addended by: Cheree Ditto, Larae Caison A on: 10/01/2019 10:59 AM   Modules accepted: Orders

## 2019-11-24 ENCOUNTER — Ambulatory Visit: Payer: Medicaid Other | Admitting: Obstetrics & Gynecology

## 2019-12-23 ENCOUNTER — Other Ambulatory Visit: Payer: Self-pay

## 2019-12-23 ENCOUNTER — Ambulatory Visit (INDEPENDENT_AMBULATORY_CARE_PROVIDER_SITE_OTHER): Payer: Medicaid Other

## 2019-12-23 VITALS — BP 117/72 | HR 84 | Wt 142.0 lb

## 2019-12-23 DIAGNOSIS — Z30431 Encounter for routine checking of intrauterine contraceptive device: Secondary | ICD-10-CM

## 2019-12-23 DIAGNOSIS — Z975 Presence of (intrauterine) contraceptive device: Secondary | ICD-10-CM | POA: Diagnosis not present

## 2019-12-23 NOTE — Progress Notes (Signed)
    GYNECOLOGY OFFICE ENCOUNTER NOTE  History:  Elizabeth [name] is a 29 y.o. [phone] here today for today for IUD string check; Liletta  IUD was placed  October 01, 2019. No complaints about the IUD but patient states she is unable to feel strings. Patient admits that she has not felt strings since insertion and did not return for one month check up. Patient reports monthly menses that's ~7 days, light to medium, with cramping prior to her menses, but none during.  However, patient reports her symptoms are relieved with tylenol usage.  Patient also states her partner was once able to feel strings, but does not feel them now.   The following portions of the patient's history were reviewed and updated as appropriate: allergies, current medications, past family history, past medical history, past social history, past surgical history and problem list. Last pap smear on Sept 2020 was normal, negative HRHPV.  Review of Systems:  Pertinent items are noted in HPI.   Objective:  Physical Exam Blood pressure 117/72, pulse 84, weight 142 lb (64.4 kg), last menstrual period 11/25/2019, not currently breastfeeding. CONSTITUTIONAL: Well-developed, well-nourished female in no acute distress.  HENT:  Normocephalic, atraumatic. External right and left ear normal. Oropharynx is clear and moist EYES: Conjunctivae and EOM are normal. Pupils are equal, round, and reactive to light. No scleral icterus.  NECK: Normal range of motion, supple, no masses CARDIOVASCULAR: Normal heart rate noted RESPIRATORY: Effort and breath sounds normal, no problems with respiration noted ABDOMEN: Soft, no distention noted.   PELVIC: Normal appearing external genitalia; normal appearing vaginal mucosa and cervix. Blue IUD strings visualized, about 3 cm in length outside cervix.   Assessment & Plan:  29 year old IUD in Place  -Patient reassured that IUD in place. -Informed that intermittent pelvic pain and cramping is normal  variation, with IUD, prior to menses.  -Encouraged to continue to monitor and report any changes in pelvic pain including increased duration, frequency, or intensity.  Cherre Robins MSN, CNM Advanced Practice Provider, [facility] [employer]

## 2019-12-23 NOTE — Progress Notes (Signed)
Pt presents for IUD check c/o intermittent pelvic pain.

## 2020-05-07 ENCOUNTER — Other Ambulatory Visit: Payer: Self-pay

## 2020-05-07 DIAGNOSIS — R399 Unspecified symptoms and signs involving the genitourinary system: Secondary | ICD-10-CM

## 2020-05-11 ENCOUNTER — Ambulatory Visit: Payer: Medicaid Other

## 2020-05-11 ENCOUNTER — Other Ambulatory Visit: Payer: Self-pay

## 2020-05-11 ENCOUNTER — Other Ambulatory Visit (HOSPITAL_COMMUNITY)
Admission: RE | Admit: 2020-05-11 | Discharge: 2020-05-11 | Disposition: A | Discharge status: Home / Self Care | Payer: Medicaid Other | Source: Ambulatory Visit | Attending: Obstetrics | Admitting: Obstetrics

## 2020-05-11 DIAGNOSIS — R309 Painful micturition, unspecified: Secondary | ICD-10-CM | POA: Diagnosis not present

## 2020-05-11 DIAGNOSIS — N898 Other specified noninflammatory disorders of vagina: Secondary | ICD-10-CM | POA: Diagnosis not present

## 2020-05-11 NOTE — Progress Notes (Signed)
Patient was assessed and managed by nursing staff during this encounter. I have reviewed the chart and agree with the documentation and plan. I have also made any necessary editorial changes.  Catalina Antigua, MD 05/11/2020 4:24 PM

## 2020-05-11 NOTE — Progress Notes (Signed)
Pt is in the office for self swab, reports some vaginal discharge and irritation after urination. Pt leaving a urine sample to check for uti as well.

## 2020-05-12 LAB — CERVICOVAGINAL ANCILLARY ONLY
Bacterial Vaginitis (gardnerella): POSITIVE — AB
Candida Glabrata: NEGATIVE
Candida Vaginitis: NEGATIVE
Chlamydia: NEGATIVE
Comment: NEGATIVE
Comment: NEGATIVE
Comment: NEGATIVE
Comment: NEGATIVE
Comment: NEGATIVE
Comment: NORMAL
Neisseria Gonorrhea: NEGATIVE
Trichomonas: NEGATIVE

## 2020-05-13 LAB — URINE CULTURE

## 2020-05-15 ENCOUNTER — Other Ambulatory Visit: Payer: Self-pay | Admitting: Obstetrics and Gynecology

## 2020-05-15 MED ORDER — NITROFURANTOIN MONOHYD MACRO 100 MG PO CAPS
100.0000 mg | ORAL_CAPSULE | Freq: Two times a day (BID) | ORAL | 1 refills | Status: DC
Start: 2020-05-15 — End: 2021-02-16

## 2020-05-15 MED ORDER — METRONIDAZOLE 500 MG PO TABS
500.0000 mg | ORAL_TABLET | Freq: Two times a day (BID) | ORAL | 0 refills | Status: DC
Start: 2020-05-15 — End: 2021-02-16

## 2020-09-11 ENCOUNTER — Ambulatory Visit: Payer: Medicaid Other | Admitting: Obstetrics

## 2020-09-13 ENCOUNTER — Ambulatory Visit: Payer: Medicaid Other | Admitting: Women's Health

## 2020-09-26 ENCOUNTER — Other Ambulatory Visit: Payer: Self-pay

## 2020-09-26 DIAGNOSIS — R399 Unspecified symptoms and signs involving the genitourinary system: Secondary | ICD-10-CM

## 2021-02-16 ENCOUNTER — Inpatient Hospital Stay (HOSPITAL_COMMUNITY)
Admission: AD | Admit: 2021-02-16 | Discharge: 2021-02-16 | Disposition: A | Discharge status: Home / Self Care | Payer: Medicaid Other | Attending: Obstetrics and Gynecology | Admitting: Obstetrics and Gynecology

## 2021-02-16 ENCOUNTER — Other Ambulatory Visit: Payer: Self-pay

## 2021-02-16 DIAGNOSIS — R102 Pelvic and perineal pain: Secondary | ICD-10-CM | POA: Diagnosis present

## 2021-02-16 DIAGNOSIS — Z3202 Encounter for pregnancy test, result negative: Secondary | ICD-10-CM | POA: Diagnosis not present

## 2021-02-16 DIAGNOSIS — Z975 Presence of (intrauterine) contraceptive device: Secondary | ICD-10-CM | POA: Diagnosis not present

## 2021-02-16 LAB — POCT PREGNANCY, URINE: Preg Test, Ur: NEGATIVE

## 2021-02-16 NOTE — MAU Note (Signed)
Period is late. Period in July was late, them came briefly.  Has hx of ectopic.  Has an IUD- did not check for strings. Having nausea, spotting and cramping- been going on since expected period, 8/22.  Denies spotting or pain today.

## 2021-02-16 NOTE — MAU Provider Note (Signed)
Event Date/Time   First Provider Initiated Contact with Patient 02/16/21 0950     S Ms. Elizabeth [name] is a 30 y.o. [id number] patient who presents to MAU today with complaint of spotting and cramping. She has an IUD in place and is concerned she has an ectopic pregnancy.   O BP 117/79 (BP Location: Right Arm)   Pulse 92   Temp 99.4 F (37.4 C) (Oral)   Resp 18   Ht 5\' 4"  (1.626 m)   Wt 67.5 kg   LMP 12/29/2020   SpO2 98%   BMI 25.54 kg/m  Physical Exam Vitals and nursing note reviewed.  Constitutional:      General: She is not in acute distress.    Appearance: She is well-developed.  HENT:     Head: Normocephalic.  Eyes:     Pupils: Pupils are equal, round, and reactive to light.  Cardiovascular:     Rate and Rhythm: Normal rate and regular rhythm.     Heart sounds: Normal heart sounds.  Pulmonary:     Effort: Pulmonary effort is normal. No respiratory distress.     Breath sounds: Normal breath sounds.  Abdominal:     General: Bowel sounds are normal. There is no distension.     Palpations: Abdomen is soft.     Tenderness: There is no abdominal tenderness.  Skin:    General: Skin is warm and dry.  Neurological:     Mental Status: She is alert and oriented to person, place, and time.  Psychiatric:        Mood and Affect: Mood normal.        Behavior: Behavior normal.        Thought Content: Thought content normal.        Judgment: Judgment normal.   Results for orders placed or performed during the hospital encounter of 02/16/21 (from the past 24 hour(s))  Pregnancy, urine POC     Status: None   Collection Time: 02/16/21  9:48 AM  Result Value Ref Range   Preg Test, Ur NEGATIVE NEGATIVE    A Medical screening exam complete 1. Negative pregnancy test     P Discharge from MAU in stable condition Patient given the option of transfer to [facility] for further evaluation or seek care in outpatient facility of choice  List of options for follow-up given  Warning  signs for worsening condition that would warrant emergency follow-up discussed Patient may return to MAU as needed   [facility], CNM 02/16/2021 9:50 AM

## 2021-03-09 ENCOUNTER — Ambulatory Visit (INDEPENDENT_AMBULATORY_CARE_PROVIDER_SITE_OTHER): Payer: Medicaid Other | Admitting: Nurse Practitioner

## 2021-03-09 ENCOUNTER — Encounter (HOSPITAL_BASED_OUTPATIENT_CLINIC_OR_DEPARTMENT_OTHER): Payer: Self-pay | Admitting: Nurse Practitioner

## 2021-03-09 ENCOUNTER — Other Ambulatory Visit: Payer: Self-pay

## 2021-03-09 DIAGNOSIS — Z30432 Encounter for removal of intrauterine contraceptive device: Secondary | ICD-10-CM | POA: Insufficient documentation

## 2021-03-09 DIAGNOSIS — Z30431 Encounter for routine checking of intrauterine contraceptive device: Secondary | ICD-10-CM | POA: Diagnosis not present

## 2021-03-09 DIAGNOSIS — Z7689 Persons encountering health services in other specified circumstances: Secondary | ICD-10-CM

## 2021-03-09 NOTE — Patient Instructions (Signed)
We will see you on Tuesday morning at 10:10 for your IUD removal.  Please arrive by 10am to make sure we can get your paperwork completed and rooming completed in time to complete the procedure.   You may take 800mg  Ibuprofen about 30 minutes prior to leaving home.  An additional 800mg  of ibuprofen can be taken 8 hours later.  This will help with any cramping or bleeding you may experience.

## 2021-03-09 NOTE — Progress Notes (Signed)
Virtual Visit Encounter telephone visit.  Attempt to contact patient by phone at 1440- VM left with return call information. Visit started at 1501   I connected with  Elizabeth [name] on 03/09/21 at  2:30 PM EDT by secure audio and/or video enabled telemedicine application. I verified that I am speaking with the correct person using two identifiers.   I introduced myself as a Publishing rights manager with the practice. The limitations of evaluation and management by telemedicine discussed with the patient and the availability of in person appointments. The patient expressed verbal understanding and consent to proceed.  Participating parties in this visit include: Myself and patient  The patient is: Patient Location: Home I am: Provider Location: Office/Clinic Subjective:    CC and HPI: Elizabeth [name] is a 30 y.o. year old female presenting for establish care visit.  Purity tells me that she has an IUD in place and would like to have this removed. She was recently married and she would like to plan to have more children at this time.   IUD was placed by Dr. Clearance Coots at [facility].  Her last pap was in September 2020.  She has not vaginal pain or discharge at this time.  She had a recent pregnancy test, which was negative.   She has not medical history with exception of pregnancy and ectopic pregnancy. She is not taking any routine daily medications.   Past medical history, Surgical history, Family history not pertinant except as noted below, Social history, Allergies, and medications have been entered into the medical record, reviewed, and corrections made.   Review of Systems:  All review of systems negative except what is listed in the HPI  Objective:    Alert and oriented x 4 Speaking in clear sentences with no shortness of breath. No distress.  Impression and Recommendations:    Problem List Items Addressed This Visit     Encounter to establish care with new  doctor    New patient to establish care No pertinent health history No current medication use Will plan for in person visit for requested IUD removal next week.  Old records reviewed.       Encounter for management of intrauterine contraceptive device (IUD) - Primary    Patient planning conception and requests for IUD removal Will plan to have this done next week. Recommend 800mg  Ibuprofen 30 minutes before leaving home for appt and repeat 8 hours later. Will obtain UPT and pelvic exam at time of removal.  Patient provided with instructions and information on removal.        orders and follow up as documented in EMR I discussed the assessment and treatment plan with the patient. The patient was provided an opportunity to ask questions and all were answered. The patient agreed with the plan and demonstrated an understanding of the instructions.   The patient was advised to call back or seek an in-person evaluation if the symptoms worsen or if the condition fails to improve as anticipated.  Follow-Up: Tuesday for IUD removal. Arrive at 10 am  I provided 36 minutes of non-face-to-face interaction with this non face-to-face encounter including intake, same-day documentation, and chart review.   Saturday, NP , DNP, AGNP-c [facility] at [facility] [phone] (fax)

## 2021-03-09 NOTE — Assessment & Plan Note (Signed)
New patient to establish care No pertinent health history No current medication use Will plan for in person visit for requested IUD removal next week.  Old records reviewed.

## 2021-03-09 NOTE — Assessment & Plan Note (Signed)
Patient planning conception and requests for IUD removal Will plan to have this done next week. Recommend 800mg  Ibuprofen 30 minutes before leaving home for appt and repeat 8 hours later. Will obtain UPT and pelvic exam at time of removal.  Patient provided with instructions and information on removal.

## 2021-03-13 ENCOUNTER — Encounter (HOSPITAL_BASED_OUTPATIENT_CLINIC_OR_DEPARTMENT_OTHER): Payer: Self-pay | Admitting: Nurse Practitioner

## 2021-03-13 ENCOUNTER — Ambulatory Visit (INDEPENDENT_AMBULATORY_CARE_PROVIDER_SITE_OTHER): Payer: Medicaid Other | Admitting: Nurse Practitioner

## 2021-03-13 ENCOUNTER — Other Ambulatory Visit: Payer: Self-pay

## 2021-03-13 VITALS — BP 111/77 | HR 73 | Ht 64.0 in | Wt 150.6 lb

## 2021-03-13 DIAGNOSIS — Z30432 Encounter for removal of intrauterine contraceptive device: Secondary | ICD-10-CM | POA: Diagnosis not present

## 2021-03-13 NOTE — Progress Notes (Signed)
Established Patient Office Visit  Subjective:  Patient ID: Elizabeth [name], female    DOB: 04-03-1991  Age: 30 y.o. MRN: [id number]  CC:  Chief Complaint  Patient presents with   Contraception    Patient states she wants her IUD removed.   Establish Care    HPI Elizabeth [name] presents for IUD removal.  She is recently married and would like to consider pregnancy in the near future.  UTD on pap.  Recent STI testing negative. UPT 09/09 negative  ROS Review of Systems All review of systems negative except what is listed in the HPI    Objective:    Physical Exam Vitals and nursing note reviewed. Exam conducted with a chaperone present.  Constitutional:      Appearance: Normal appearance. She is normal weight.  HENT:     Head: Normocephalic.  Eyes:     Extraocular Movements: Extraocular movements intact.     Conjunctiva/sclera: Conjunctivae normal.     Pupils: Pupils are equal, round, and reactive to light.  Cardiovascular:     Rate and Rhythm: Normal rate.     Pulses: Normal pulses.  Pulmonary:     Effort: Pulmonary effort is normal.  Abdominal:     General: Abdomen is flat. Bowel sounds are normal. There is no distension.     Palpations: Abdomen is soft. There is no mass.     Tenderness: There is no abdominal tenderness. There is no guarding or rebound.     Hernia: No hernia is present.  Genitourinary:    General: Normal vulva.     Exam position: Lithotomy position.     Cervix: No cervical motion tenderness, friability or erythema.     Uterus: Deviated.      Comments: Scant menstrual bleeding present.  Musculoskeletal:        General: Normal range of motion.     Cervical back: Normal range of motion.     Right lower leg: No edema.     Left lower leg: No edema.  Skin:    General: Skin is warm and dry.     Capillary Refill: Capillary refill takes less than 2 seconds.  Neurological:     General: No focal deficit present.     Mental Status: She is  alert and oriented to person, place, and time.  Psychiatric:        Mood and Affect: Mood normal.        Behavior: Behavior normal.        Thought Content: Thought content normal.        Judgment: Judgment normal.    BP 111/77   Pulse 73   Ht 5\' 4"  (1.626 m)   Wt 150 lb 9.6 oz (68.3 kg)   LMP  (LMP Unknown)   BMI 25.85 kg/m  Wt Readings from Last 3 Encounters:  03/13/21 150 lb 9.6 oz (68.3 kg)  02/16/21 148 lb 12.8 oz (67.5 kg)  12/23/19 142 lb (64.4 kg)     Assessment & Plan:   Problem List Items Addressed This Visit     Encounter for IUD removal - Primary    IUD removal performed today without complication.  UPT deferred due to active menses. Verbal consent obtained. Speculum passed into vagina with clear visualization of the cervix. Cervix deviated posteriorly and to the patients right. Blue IUD strings visualized. Cytology brush utilized to retrieve IUD strings from os and vaginal fornix for best grip. Ring forceps introduced into vaginal vault and  strings gripped. IUD removed in entirety with no complications. Patient tolerated procedure well with no reported pain. Minimal increase in BR vaginal blood present post procedure.  Patient provided with post removal instructions and precautions.        No orders of the defined types were placed in this encounter.  Time: 20 minutes, >50% spent counseling, care coordination, chart review, and documentation.   Follow-up: Return if symptoms worsen or fail to improve.    [name], NP

## 2021-03-13 NOTE — Patient Instructions (Addendum)
IUD Post-procedure Instructions Cramping is common.  You may take Ibuprofen, Aleve, or Tylenol for the cramping.  This should resolve within 24 hours.   You may have a small amount of spotting.  You should wear a mini pad for the next few days. You need to call the office if you have any pelvic pain, fever, heavy bleeding, or foul smelling vaginal discharge. Shower or bathe as normal  Please note that pregnancy protection ends as soon as IUD is removed. Some women may take a month or two for cycles to return to normal, but even without normal cycles, there is the possibility of pregnancy. If you are not attempting to conceive at this time, please use alternative birth control with every sexual encounter.   You may take ibuprofen 800mg  every 8 hours to help with cramping after procedure.

## 2021-03-13 NOTE — Assessment & Plan Note (Signed)
IUD removal performed today without complication.  UPT deferred due to active menses. Verbal consent obtained. Speculum passed into vagina with clear visualization of the cervix. Cervix deviated posteriorly and to the patients right. Blue IUD strings visualized. Cytology brush utilized to retrieve IUD strings from os and vaginal fornix for best grip. Ring forceps introduced into vaginal vault and strings gripped. IUD removed in entirety with no complications. Patient tolerated procedure well with no reported pain. Minimal increase in BR vaginal blood present post procedure.  Patient provided with post removal instructions and precautions.

## 2021-03-26 ENCOUNTER — Other Ambulatory Visit: Payer: Self-pay

## 2021-03-26 ENCOUNTER — Encounter (HOSPITAL_BASED_OUTPATIENT_CLINIC_OR_DEPARTMENT_OTHER): Payer: Self-pay | Admitting: Nurse Practitioner

## 2021-03-26 ENCOUNTER — Ambulatory Visit (INDEPENDENT_AMBULATORY_CARE_PROVIDER_SITE_OTHER): Payer: Medicaid Other | Admitting: Nurse Practitioner

## 2021-03-26 VITALS — BP 106/62 | HR 65 | Ht 64.0 in | Wt 149.8 lb

## 2021-03-26 DIAGNOSIS — N3090 Cystitis, unspecified without hematuria: Secondary | ICD-10-CM | POA: Insufficient documentation

## 2021-03-26 DIAGNOSIS — R399 Unspecified symptoms and signs involving the genitourinary system: Secondary | ICD-10-CM | POA: Diagnosis not present

## 2021-03-26 DIAGNOSIS — N3001 Acute cystitis with hematuria: Secondary | ICD-10-CM | POA: Insufficient documentation

## 2021-03-26 LAB — POCT URINALYSIS DIPSTICK
Bilirubin, UA: NEGATIVE
Ketones, UA: NEGATIVE
Nitrite, UA: POSITIVE
Protein, UA: POSITIVE — AB
Spec Grav, UA: 1.015 (ref 1.010–1.025)
Urobilinogen, UA: 1 E.U./dL
pH, UA: 7 (ref 5.0–8.0)

## 2021-03-26 MED ORDER — FLUCONAZOLE 150 MG PO TABS: 150.0000 mg | ORAL_TABLET | Freq: Once | ORAL | 2 refills | Status: AC

## 2021-03-26 MED ORDER — NITROFURANTOIN MONOHYD MACRO 100 MG PO CAPS: 100.0000 mg | ORAL_CAPSULE | Freq: Two times a day (BID) | ORAL | 0 refills | Status: DC

## 2021-03-26 MED ORDER — ONDANSETRON 8 MG PO TBDP: 8.0000 mg | ORAL_TABLET | Freq: Three times a day (TID) | ORAL | 3 refills | Status: DC | PRN

## 2021-03-26 MED ORDER — NITROFURANTOIN MONOHYD MACRO 100 MG PO CAPS: 100.0000 mg | ORAL_CAPSULE | Freq: Two times a day (BID) | ORAL | 2 refills | Status: DC

## 2021-03-26 NOTE — Progress Notes (Signed)
Acute Office Visit  Subjective:    Patient ID: Elizabeth [name], female    DOB: 1990/08/07, 30 y.o.   MRN: [id number]  Chief Complaint  Patient presents with   Urinary Tract Infection    Patient is cramping and pain when urinating.  Low back pain since Saturday.    HPI Patient is in today for symptoms associated with UTI.  URINARY SYMPTOMS that started on Saturday.   Dysuria: yes Urinary frequency: yes Urgency: yes Small volume voids: yes Urinary incontinence: no Foul odor: yes Hematuria: no Abdominal pain: yes Back pain: no Suprapubic pain/pressure: yes Flank pain: yes Fever:  no Nausea: Yes  Vomiting: yes Diarrhea: No  Treatments attempted: pyridium and increasing fluids  Any relief with treatments: Yes  Status: Stable- a little better after starting AZO yesterday Previous urinary tract infection: yes Recurrent urinary tract infection: yes- has not had one in a year, but previously did have recurrent UTI.  Sexual activity: yes- monogamous History of sexually transmitted disease: no Vaginal Symptoms: no Post Menopausal: no  No Known Allergies  Review of Systems All review of systems negative except what is listed in the HPI     Objective:    Physical Exam Vitals and nursing note reviewed.  Constitutional:      General: She is not in acute distress.    Appearance: Normal appearance. She is normal weight.  Eyes:     Extraocular Movements: Extraocular movements intact.     Conjunctiva/sclera: Conjunctivae normal.     Pupils: Pupils are equal, round, and reactive to light.  Cardiovascular:     Rate and Rhythm: Normal rate and regular rhythm.     Pulses: Normal pulses.     Heart sounds: Normal heart sounds.  Pulmonary:     Effort: Pulmonary effort is normal.     Breath sounds: Normal breath sounds.  Abdominal:     General: Abdomen is flat. There is no distension.     Palpations: Abdomen is soft.     Tenderness: There is abdominal tenderness. There  is right CVA tenderness. There is no left CVA tenderness, guarding or rebound.     Comments: Reports CVA tenderness improved with starting AZO  Musculoskeletal:        General: Normal range of motion.     Right lower leg: No edema.     Left lower leg: No edema.  Skin:    General: Skin is warm and dry.     Capillary Refill: Capillary refill takes less than 2 seconds.  Neurological:     General: No focal deficit present.     Mental Status: She is alert and oriented to person, place, and time.  Psychiatric:        Mood and Affect: Mood normal.        Behavior: Behavior normal.        Thought Content: Thought content normal.        Judgment: Judgment normal.    BP 106/62   Pulse 65   Ht 5\' 4"  (1.626 m)   Wt 149 lb 12.8 oz (67.9 kg)   LMP  (LMP Unknown)   SpO2 100%   BMI 25.71 kg/m  Wt Readings from Last 3 Encounters:  03/26/21 149 lb 12.8 oz (67.9 kg)  03/13/21 150 lb 9.6 oz (68.3 kg)  02/16/21 148 lb 12.8 oz (67.5 kg)       Assessment & Plan:   Problem List Items Addressed This Visit     Cystitis -  Primary    Symptoms and presentation consistent with UTI.  Will send treatment today with macrobid while awaiting urine culture results.  Diflucan sent for yeast symptoms after antibiotic use, should they present.  Will also send zofran for nausea. Recommend continue increased fluids and finish AZO treatment.  If symptoms not improved by Thursday, patient will let me know.        Relevant Medications   fluconazole (DIFLUCAN) 150 MG tablet   nitrofurantoin, macrocrystal-monohydrate, (MACROBID) 100 MG capsule   ondansetron (ZOFRAN-ODT) 8 MG disintegrating tablet   Other Relevant Orders   POCT urinalysis dipstick (Completed)   Urine Culture     Meds ordered this encounter  Medications   DISCONTD: nitrofurantoin, macrocrystal-monohydrate, (MACROBID) 100 MG capsule    Sig: Take 1 capsule (100 mg total) by mouth 2 (two) times daily.    Dispense:  10 capsule    Refill:   0   fluconazole (DIFLUCAN) 150 MG tablet    Sig: Take 1 tablet (150 mg total) by mouth once for 1 dose. May take 2nd dose if symptoms still present after 72 hours    Dispense:  2 tablet    Refill:  2   nitrofurantoin, macrocrystal-monohydrate, (MACROBID) 100 MG capsule    Sig: Take 1 capsule (100 mg total) by mouth 2 (two) times daily. For 5 days    Dispense:  10 capsule    Refill:  2   ondansetron (ZOFRAN-ODT) 8 MG disintegrating tablet    Sig: Take 1 tablet (8 mg total) by mouth every 8 (eight) hours as needed for nausea.    Dispense:  20 tablet    Refill:  3     [name], NP

## 2021-03-26 NOTE — Assessment & Plan Note (Addendum)
Symptoms and presentation consistent with UTI.  Will send treatment today with macrobid while awaiting urine culture results. - Refills provided due to recurrent UTI history- may need to consider prophylactic medication if these begin to occur frequently again.  Diflucan sent for yeast symptoms after antibiotic use, should they present.  Will also send zofran for nausea. Recommend continue increased fluids and finish AZO treatment.  If symptoms not improved by Thursday, patient will let me know.

## 2021-03-26 NOTE — Patient Instructions (Signed)
I have sent in macrobid, which should resolve the infection present. If the culture comes back to show that you need something different based on the specific bacteria, we will let you know.   You can continue to take Azo as directed.   If you have symptoms of vaginal itching or discharge after taking the antibiotic, please let me know and I will send in diflucan for you.   If your symptoms get worse or do not get any better with the antibiotic, please let me know.

## 2021-03-29 LAB — URINE CULTURE

## 2021-04-30 ENCOUNTER — Ambulatory Visit (HOSPITAL_BASED_OUTPATIENT_CLINIC_OR_DEPARTMENT_OTHER): Payer: Medicaid Other | Admitting: Nurse Practitioner

## 2021-05-01 ENCOUNTER — Ambulatory Visit (INDEPENDENT_AMBULATORY_CARE_PROVIDER_SITE_OTHER): Payer: Medicaid Other | Admitting: Nurse Practitioner

## 2021-05-01 ENCOUNTER — Other Ambulatory Visit: Payer: Self-pay

## 2021-05-01 ENCOUNTER — Encounter (HOSPITAL_BASED_OUTPATIENT_CLINIC_OR_DEPARTMENT_OTHER): Payer: Self-pay | Admitting: Nurse Practitioner

## 2021-05-01 VITALS — BP 128/82 | HR 77 | Ht 64.0 in | Wt 148.0 lb

## 2021-05-01 DIAGNOSIS — Z3201 Encounter for pregnancy test, result positive: Secondary | ICD-10-CM | POA: Diagnosis not present

## 2021-05-01 LAB — POCT URINE PREGNANCY: Preg Test, Ur: POSITIVE — AB

## 2021-05-01 MED ORDER — PRENATAL VITAMINS 28-0.8 MG PO TABS: ORAL_TABLET | ORAL | 10 refills | Status: AC

## 2021-05-01 NOTE — Assessment & Plan Note (Signed)
Positive UPT in office today.  Will get quant HCG for further evaluation.  Patient provided with prescription for prenatal vitamins.  Discussion and handout provided for safe OTC medications during pregnancy.  Monitor for cramping, abdominal pain, nausea, vaginal bleeding.  Notify the office immediately for pain and/or bleeding or seek emergency care.  Hx of ectopic pregnancy in past.  Will send referral to Dr. Hyacinth Meeker.

## 2021-05-01 NOTE — Patient Instructions (Signed)
CONGRATULATIONS!!!!!!!!!!!!!!  We will let you know about the results of the blood test.  Call me with any questions or concerns.  Drink plenty of water and eat healthy.  You should expect a call from OB GYN to schedule your first appointment.

## 2021-05-01 NOTE — Progress Notes (Signed)
Acute Office Visit  Subjective:    Patient ID: Elizabeth [name], female    DOB: 06-04-1991, 30 y.o.   MRN: [id number]  Chief Complaint  Patient presents with   pregnacy test    Patient comes in today for follow up of a home pregnancy test. She did have a positive urine test in the office today. We drew blood as well. She would like a referral to Dr Hyacinth Meeker.     HPI Patient is in today for follow-up for positive at home pregnancy test. IUD removal on 03/26/2021. Her LMP was 03/27/2021. She is currently sexually active with her spouse. No STI hx or concerns today. She does have a hx of ectopic pregnancy in the past. She reports she and her spouse are very excited about the recent positive test.  She is taking a multivitamin at this time.   She has had no vaginal bleeding. Mild cramping about a week ago that has resolved. She is not on any current medications. She does not use illegal substances or alcohol.   Past Medical History:  Diagnosis Date   No pertinent past medical history    hx ectopic pregnancy    Past Surgical History:  Procedure Laterality Date   THERAPEUTIC ABORTION      Family History  Problem Relation Age of Onset   Hearing loss Mother    Thyroid disease Mother    Anesthesia problems Neg Hx     Social History   Socioeconomic History   Marital status: Married    Spouse name: Not on file   Number of children: Not on file   Years of education: Not on file   Highest education level: Not on file  Occupational History    Comment: In Home [employer]  Tobacco Use   Smoking status: Former    Types: Cigarettes    Quit date: 06/02/2014    Years since quitting: 6.9   Smokeless tobacco: Never  Vaping Use   Vaping Use: Never used  Substance and Sexual Activity   Alcohol use: Yes    Comment: socially   Drug use: No   Sexual activity: Yes    Partners: Male    Birth control/protection: I.U.D.  Other Topics Concern   Not on file  Social History Narrative    Not on file   Social Determinants of Health   Financial Resource Strain: Not on file  Food Insecurity: Not on file  Transportation Needs: Not on file  Physical Activity: Not on file  Stress: Not on file  Social Connections: Not on file  Intimate Partner Violence: Not on file    Outpatient Medications Prior to Visit  Medication Sig Dispense Refill   nitrofurantoin, macrocrystal-monohydrate, (MACROBID) 100 MG capsule Take 1 capsule (100 mg total) by mouth 2 (two) times daily. For 5 days 10 capsule 2   ondansetron (ZOFRAN-ODT) 8 MG disintegrating tablet Take 1 tablet (8 mg total) by mouth every 8 (eight) hours as needed for nausea. 20 tablet 3   No facility-administered medications prior to visit.    No Known Allergies  Review of Systems All review of systems negative except what is listed in the HPI     Objective:    Physical Exam Vitals and nursing note reviewed.  Constitutional:      Appearance: Normal appearance. She is normal weight.  Eyes:     Extraocular Movements: Extraocular movements intact.     Conjunctiva/sclera: Conjunctivae normal.     Pupils: Pupils are equal,  round, and reactive to light.  Cardiovascular:     Rate and Rhythm: Normal rate and regular rhythm.     Pulses: Normal pulses.     Heart sounds: Normal heart sounds. No murmur heard. Pulmonary:     Effort: Pulmonary effort is normal.     Breath sounds: Normal breath sounds.  Abdominal:     General: Abdomen is flat. Bowel sounds are normal. There is no distension.     Palpations: Abdomen is soft. There is no mass.     Tenderness: There is no abdominal tenderness. There is no right CVA tenderness, left CVA tenderness, guarding or rebound.  Musculoskeletal:     Cervical back: Normal range of motion. No tenderness.     Right lower leg: No edema.     Left lower leg: No edema.  Lymphadenopathy:     Cervical: No cervical adenopathy.  Skin:    General: Skin is warm and dry.     Capillary Refill:  Capillary refill takes less than 2 seconds.  Neurological:     General: No focal deficit present.     Mental Status: She is alert and oriented to person, place, and time.  Psychiatric:        Mood and Affect: Mood normal.        Behavior: Behavior normal.        Thought Content: Thought content normal.        Judgment: Judgment normal.    BP 128/82   Pulse 77   Ht 5\' 4"  (1.626 m)   Wt 148 lb (67.1 kg)   SpO2 98%   BMI 25.40 kg/m  Wt Readings from Last 3 Encounters:  05/01/21 148 lb (67.1 kg)  03/26/21 149 lb 12.8 oz (67.9 kg)  03/13/21 150 lb 9.6 oz (68.3 kg)    Health Maintenance Due  Topic Date Due   COVID-19 Vaccine (1) Never done    There are no preventive care reminders to display for this patient.   Lab Results  Component Value Date   TSH 0.687 05/03/2015   Lab Results  Component Value Date   WBC 20.0 (H) 03/29/2018   HGB 9.1 (L) 03/29/2018   HCT 27.4 (L) 03/29/2018   MCV 82.5 03/29/2018   PLT 271 03/29/2018   Lab Results  Component Value Date   NA 136 11/08/2015   K 4.2 11/08/2015   CO2 21 (L) 11/08/2015   GLUCOSE 103 (H) 11/08/2015   BUN 7 11/08/2015   CREATININE 0.62 11/08/2015   BILITOT 0.9 11/08/2015   ALKPHOS 135 (H) 11/08/2015   AST 14 (L) 11/08/2015   ALT 8 (L) 11/08/2015   PROT 6.2 (L) 11/08/2015   ALBUMIN 2.8 (L) 11/08/2015   CALCIUM 8.9 11/08/2015   ANIONGAP 9 11/08/2015   No results found for: CHOL No results found for: HDL No results found for: LDLCALC No results found for: TRIG No results found for: CHOLHDL No results found for: 11/10/2015     Assessment & Plan:   Problem List Items Addressed This Visit     Positive pregnancy test - Primary    Positive UPT in office today.  Will get quant HCG for further evaluation.  Patient provided with prescription for prenatal vitamins.  Discussion and handout provided for safe OTC medications during pregnancy.  Monitor for cramping, abdominal pain, nausea, vaginal bleeding.  Notify  the office immediately for pain and/or bleeding or seek emergency care.  Hx of ectopic pregnancy in past.  Will send referral  to Dr. Hyacinth Meeker.       Relevant Medications   Prenatal Vit-Fe Fumarate-FA (PRENATAL VITAMINS) 28-0.8 MG TABS   Other Relevant Orders   POCT urine pregnancy (Completed)   Beta HCG, Quant   Ambulatory referral to Obstetrics / Gynecology     Meds ordered this encounter  Medications   Prenatal Vit-Fe Fumarate-FA (PRENATAL VITAMINS) 28-0.8 MG TABS    Sig: 1 tab by mouth daily with food.    Dispense:  30 tablet    Refill:  10   Follow-up as needed for pregnancy.   [name], NP

## 2021-05-02 ENCOUNTER — Other Ambulatory Visit (HOSPITAL_BASED_OUTPATIENT_CLINIC_OR_DEPARTMENT_OTHER): Payer: Self-pay | Admitting: *Deleted

## 2021-05-02 ENCOUNTER — Encounter (HOSPITAL_BASED_OUTPATIENT_CLINIC_OR_DEPARTMENT_OTHER): Payer: Self-pay | Admitting: *Deleted

## 2021-05-02 DIAGNOSIS — Z3481 Encounter for supervision of other normal pregnancy, first trimester: Secondary | ICD-10-CM

## 2021-05-02 LAB — BETA HCG QUANT (REF LAB): hCG Quant: 6593 m[IU]/mL

## 2021-05-02 NOTE — Telephone Encounter (Signed)
Spoke with pt regarding new OB appt for positive pregnancy test with Family Medicine. LMP was 03/27/21. Appt given. Advised, with history of ectopic, to seek care if she experiences any vaginal bleeding or pain. Pt verbalized understanding.

## 2021-05-31 ENCOUNTER — Encounter (HOSPITAL_BASED_OUTPATIENT_CLINIC_OR_DEPARTMENT_OTHER): Payer: Self-pay | Admitting: Obstetrics & Gynecology

## 2021-05-31 ENCOUNTER — Ambulatory Visit (INDEPENDENT_AMBULATORY_CARE_PROVIDER_SITE_OTHER): Payer: Medicaid Other | Admitting: *Deleted

## 2021-05-31 ENCOUNTER — Other Ambulatory Visit: Payer: Self-pay

## 2021-05-31 ENCOUNTER — Other Ambulatory Visit (HOSPITAL_COMMUNITY)
Admission: RE | Admit: 2021-05-31 | Discharge: 2021-05-31 | Disposition: A | Discharge status: Home / Self Care | Payer: Medicaid Other | Source: Ambulatory Visit | Attending: Obstetrics & Gynecology | Admitting: Obstetrics & Gynecology

## 2021-05-31 ENCOUNTER — Ambulatory Visit (HOSPITAL_BASED_OUTPATIENT_CLINIC_OR_DEPARTMENT_OTHER): Payer: Medicaid Other

## 2021-05-31 ENCOUNTER — Ambulatory Visit (INDEPENDENT_AMBULATORY_CARE_PROVIDER_SITE_OTHER): Payer: Medicaid Other | Admitting: Obstetrics & Gynecology

## 2021-05-31 VITALS — BP 122/86 | HR 101 | Wt 153.0 lb

## 2021-05-31 DIAGNOSIS — Z3481 Encounter for supervision of other normal pregnancy, first trimester: Secondary | ICD-10-CM

## 2021-05-31 DIAGNOSIS — Z2839 Other underimmunization status: Secondary | ICD-10-CM

## 2021-05-31 DIAGNOSIS — Z3480 Encounter for supervision of other normal pregnancy, unspecified trimester: Secondary | ICD-10-CM | POA: Insufficient documentation

## 2021-05-31 DIAGNOSIS — O09891 Supervision of other high risk pregnancies, first trimester: Secondary | ICD-10-CM

## 2021-05-31 DIAGNOSIS — Z3A09 9 weeks gestation of pregnancy: Secondary | ICD-10-CM | POA: Diagnosis present

## 2021-05-31 LAB — POCT URINALYSIS DIPSTICK OB
Appearance: NORMAL
Bilirubin, UA: NEGATIVE
Blood, UA: NEGATIVE
Glucose, UA: NEGATIVE
Ketones, UA: NEGATIVE
Leukocytes, UA: NEGATIVE
Nitrite, UA: NEGATIVE
Spec Grav, UA: 1.02 (ref 1.010–1.025)
Urobilinogen, UA: 0.2 E.U./dL
pH, UA: 8.5 — AB (ref 5.0–8.0)

## 2021-05-31 NOTE — Progress Notes (Signed)
History:   Elizabeth [name] is a 30 y.o. [phone] at [redacted]w[redacted]d by LMP being seen today for her first obstetrical visit.  Her obstetrical history is significant for  history of ectopic pregnancy . Patient does intend to breast feed. Pregnancy history fully reviewed.  Patient reports no complaints.      HISTORY: OB History  Gravida Para Term Preterm AB Living  6 3 3  0 2 3  SAB IAB Ectopic Multiple Live Births  0 1 1 0 3    # Outcome Date GA Lbr Len/2nd Weight Sex Delivery Anes PTL Lv  6 Current           5 Term 03/28/18 [redacted]w[redacted]d 04:50 / 00:05 6 lb 1.5 oz (2.765 kg) M Vag-Spont None  LIV     Name: Elizabeth [name]     Apgar1: 9  Apgar5: 9  4 Term 11/21/15 [redacted]w[redacted]d 01:49 / 00:07 6 lb 15.6 oz (3.165 kg) M Vag-Spont None  LIV     Name: Elizabeth [name]     Apgar1: 8  Apgar5: 9  3 Ectopic 2016          2 Term 04/21/12 [redacted]w[redacted]d 13:09 / 00:16 6 lb (2.721 kg) F Vag-Spont EPI  LIV     Name: Elizabeth [name]     Apgar1: 8  Apgar5: 9  1 IAB             Last pap smear was done 02/09/19 and was normal  Past Medical History:  Diagnosis Date   History of ectopic pregnancy    treated with methotrexate   Past Surgical History:  Procedure Laterality Date   THERAPEUTIC ABORTION     Family History  Problem Relation Age of Onset   Hearing loss Mother    Thyroid disease Mother    Anesthesia problems Neg Hx    Social History   Tobacco Use   Smoking status: Former    Types: Cigarettes    Quit date: 06/02/2014    Years since quitting: 7.0   Smokeless tobacco: Never  Vaping Use   Vaping Use: Never used  Substance Use Topics   Alcohol use: Yes    Comment: socially   Drug use: No   No Known Allergies Current Outpatient Medications on File Prior to Visit  Medication Sig Dispense Refill   ondansetron (ZOFRAN-ODT) 8 MG disintegrating tablet Take 1 tablet (8 mg total) by mouth every 8 (eight) hours as needed for nausea. 20 tablet 3   Prenatal Vit-Fe Fumarate-FA (PRENATAL VITAMINS) 28-0.8 MG TABS 1  tab by mouth daily with food. 30 tablet 10   No current facility-administered medications on file prior to visit.    Review of Systems Pertinent items noted in HPI and remainder of comprehensive ROS otherwise negative.  Physical Exam:   Vitals:   05/31/21 1433  BP: 122/86  Pulse: (!) 101  Weight: 153 lb (69.4 kg)   Fetal Heart Rate (bpm): 172 Bedside Ultrasound for FHR check: Viable intrauterine pregnancy with positive cardiac activity noted, fetal heart rate 155bpm Patient informed that the ultrasound is considered a limited obstetric ultrasound and is not intended to be a complete ultrasound exam.  Patient also informed that the ultrasound is not being completed with the intent of assessing for fetal or placental anomalies or any pelvic abnormalities.  Explained that the purpose of todays ultrasound is to assess for fetal heart rate.  Patient acknowledges the purpose of the exam and the limitations of the study. General: well-developed, well-nourished  female in no acute distress  Breasts:  normal appearance, no masses or tenderness bilaterally  Skin: normal coloration and turgor, no rashes  Neurologic: oriented, normal, negative, normal mood  Extremities: normal strength, tone, and muscle mass, ROM of all joints is normal  HEENT PERRLA, extraocular movement intact and sclera clear, anicteric  Neck supple and no masses  Cardiovascular: regular rate and rhythm  Respiratory:  no respiratory distress, normal breath sounds  Abdomen: soft, non-tender; bowel sounds normal; no masses,  no organomegaly  Pelvic: normal external genitalia, no lesions, normal vaginal mucosa, normal vaginal discharge, normal cervix, No pap smear done. Uterine size:  8 - 10 weeks    Assessment:    Pregnancy: [id number] Patient Active Problem List   Diagnosis Date Noted   Supervision of other normal pregnancy, antepartum 05/31/2021   Cystitis 03/26/2021     Plan:    1. Encounter for supervision of other  normal pregnancy in first trimester - Urine Culture - Cervicovaginal ancillary only( [place]) - Babyscripts Schedule Optimization  2. [redacted] weeks gestation of pregnancy - pt will return in about two weeks for prenatal labs and NIPS testing.  Have reviewed prenatal labs back prior to 2016 and cannot find testing for genetic carrier status.  Will order this with lab work as well. - Urine Culture - Cervicovaginal ancillary only( [place]) - Babyscripts Schedule Optimization  Continue prenatal vitamins. Problem list reviewed and updated. Genetic Screening discussed, NIPS: requested. Ultrasound discussed; fetal anatomic survey: requested. Anticipatory guidance about prenatal visits given including labs, ultrasounds, and testing. Discussed usage of Babyscripts and virtual visits as additional source of managing and completing prenatal visits in midst of coronavirus and pandemic.   Encouraged to complete MyChart Registration for her ability to review results, send requests, and have questions addressed.  The nature of  - Center for [facility] with multiple MDs and Advanced Practice Providers was explained to patient; also emphasized that residents, students are part of our team. Routine obstetric precautions reviewed. Encouraged to seek out care at office or emergency room [facility] MAU preferred) for urgent and/or emergent concerns. Return in about 4 weeks (around 06/28/2021).     Lum Keas, MD, FACOG Obstetrician & Gynecologist, [facility] for [facility], [facility]

## 2021-05-31 NOTE — Progress Notes (Signed)
Pt here for New OB interview. OB booklet given and discussed. No complaints or concerns.

## 2021-06-01 DIAGNOSIS — Z3481 Encounter for supervision of other normal pregnancy, first trimester: Secondary | ICD-10-CM | POA: Diagnosis not present

## 2021-06-01 DIAGNOSIS — Z3A09 9 weeks gestation of pregnancy: Secondary | ICD-10-CM | POA: Diagnosis not present

## 2021-06-01 LAB — CERVICOVAGINAL ANCILLARY ONLY
Chlamydia: NEGATIVE
Comment: NEGATIVE
Comment: NORMAL
Neisseria Gonorrhea: NEGATIVE

## 2021-06-05 ENCOUNTER — Encounter (HOSPITAL_BASED_OUTPATIENT_CLINIC_OR_DEPARTMENT_OTHER): Payer: Self-pay | Admitting: Obstetrics & Gynecology

## 2021-06-05 LAB — URINE CULTURE

## 2021-06-07 ENCOUNTER — Other Ambulatory Visit (HOSPITAL_BASED_OUTPATIENT_CLINIC_OR_DEPARTMENT_OTHER): Payer: Medicaid Other

## 2021-06-07 ENCOUNTER — Encounter (HOSPITAL_BASED_OUTPATIENT_CLINIC_OR_DEPARTMENT_OTHER): Payer: Self-pay

## 2021-06-07 NOTE — Progress Notes (Signed)
Pt here for Prenatal labs

## 2021-06-10 NOTE — L&D Delivery Note (Cosign Needed Addendum)
LABOR COURSE Patient arrived to MAU having already delivered in the car.   Delivery Note Called to room and patient had delivered in car. At  0125 a viable female was delivered via Vaginal, Spontaneous. Placenta not yet delivered on arrival.  Cord clamped, and cut by FOB. Cord blood drawn. Placenta delivered spontaneously with gentle cord traction. Appears intact. Fundus firm with massage and Pitocin. Labia, perineum, vagina, and cervix inspected with no lacerations.   APGAR: 10,10; weight  pending.   Cord: 3VC    Anesthesia:  None Episiotomy: None Lacerations: none  Suture Repair:  Est. Blood Loss (mL): <10  Mom to postpartum.  Baby to Couplet care / Skin to Skin.  Glendale Chard, DO @TODAY @ 2:02 AM      Attestation:  I confirm that I have verified the information documented in the resident's note and that I have also personally reperformed the physical exam and all medical decision making activities.   I was gloved and present for admission immediately post Car delivery SVD without incident No lacerations  , CNM

## 2021-06-12 ENCOUNTER — Other Ambulatory Visit: Payer: Self-pay

## 2021-06-12 ENCOUNTER — Other Ambulatory Visit (HOSPITAL_BASED_OUTPATIENT_CLINIC_OR_DEPARTMENT_OTHER): Payer: Medicaid Other

## 2021-06-12 DIAGNOSIS — Z3A10 10 weeks gestation of pregnancy: Secondary | ICD-10-CM | POA: Diagnosis not present

## 2021-06-12 DIAGNOSIS — Z3481 Encounter for supervision of other normal pregnancy, first trimester: Secondary | ICD-10-CM | POA: Diagnosis not present

## 2021-06-13 LAB — ABO/RH: Rh Factor: POSITIVE

## 2021-06-13 LAB — HEPATITIS C ANTIBODY: Hep C Virus Ab: <0.1 s/co ratio (ref 0.0–0.9)

## 2021-06-13 LAB — HIV ANTIBODY (ROUTINE TESTING W REFLEX): HIV Screen 4th Generation wRfx: NONREACTIVE

## 2021-06-13 LAB — CBC
Hematocrit: 36.6 % (ref 34.0–46.6)
Hemoglobin: 12.2 g/dL (ref 11.1–15.9)
MCH: 28.8 pg (ref 26.6–33.0)
MCHC: 33.3 g/dL (ref 31.5–35.7)
MCV: 87 fL (ref 79–97)
Platelets: 290 10*3/uL (ref 150–450)
RBC: 4.23 x10E6/uL (ref 3.77–5.28)
RDW: 12.3 % (ref 11.7–15.4)
WBC: 7.4 10*3/uL (ref 3.4–10.8)

## 2021-06-13 LAB — RPR: RPR Ser Ql: NONREACTIVE

## 2021-06-13 LAB — ANTIBODY SCREEN: Antibody Screen: NEGATIVE

## 2021-06-13 LAB — HEPATITIS B SURFACE ANTIGEN: Hepatitis B Surface Ag: NEGATIVE

## 2021-06-13 LAB — RUBELLA SCREEN: Rubella Antibodies, IGG: <0.9 index — ABNORMAL LOW (ref >0.99)

## 2021-06-14 DIAGNOSIS — Z2839 Other underimmunization status: Secondary | ICD-10-CM | POA: Insufficient documentation

## 2021-06-15 ENCOUNTER — Other Ambulatory Visit (HOSPITAL_BASED_OUTPATIENT_CLINIC_OR_DEPARTMENT_OTHER): Payer: Self-pay | Admitting: *Deleted

## 2021-06-15 MED ORDER — BLOOD PRESSURE KIT DEVI: 1.0000 | Freq: Once | 0 refills | Status: AC

## 2021-06-20 ENCOUNTER — Encounter (HOSPITAL_BASED_OUTPATIENT_CLINIC_OR_DEPARTMENT_OTHER): Payer: Self-pay | Admitting: *Deleted

## 2021-06-25 ENCOUNTER — Ambulatory Visit (INDEPENDENT_AMBULATORY_CARE_PROVIDER_SITE_OTHER): Payer: Medicaid Other | Admitting: Obstetrics & Gynecology

## 2021-06-25 ENCOUNTER — Other Ambulatory Visit: Payer: Self-pay

## 2021-06-25 VITALS — BP 112/75 | HR 95 | Wt 154.4 lb

## 2021-06-25 DIAGNOSIS — O09891 Supervision of other high risk pregnancies, first trimester: Secondary | ICD-10-CM

## 2021-06-25 DIAGNOSIS — O219 Vomiting of pregnancy, unspecified: Secondary | ICD-10-CM

## 2021-06-25 DIAGNOSIS — Z3480 Encounter for supervision of other normal pregnancy, unspecified trimester: Secondary | ICD-10-CM

## 2021-06-25 DIAGNOSIS — Z2839 Other underimmunization status: Secondary | ICD-10-CM

## 2021-06-25 DIAGNOSIS — Z3481 Encounter for supervision of other normal pregnancy, first trimester: Secondary | ICD-10-CM

## 2021-06-25 DIAGNOSIS — Z3A12 12 weeks gestation of pregnancy: Secondary | ICD-10-CM

## 2021-06-25 DIAGNOSIS — O09899 Supervision of other high risk pregnancies, unspecified trimester: Secondary | ICD-10-CM

## 2021-06-25 MED ORDER — DOXYLAMINE-PYRIDOXINE 10-10 MG PO TBEC: DELAYED_RELEASE_TABLET | ORAL | 1 refills | Status: DC

## 2021-06-25 NOTE — Progress Notes (Signed)
° °  PRENATAL VISIT NOTE  Subjective:  Elizabeth [name] is a 31 y.o. [phone] at [redacted]w[redacted]d being seen today for ongoing prenatal care.  She is currently monitored for the following issues for this low-risk pregnancy and has Supervision of other normal pregnancy, antepartum and Rubella non-immune status, antepartum on their problem list.  Patient reports continued nausea.  Has ondansetron she has used.  Is vomiting about every day.  Doesn't feel dehydrated at this time but would like better control for nausea/emesis.   Contractions: Not present. Vag. Bleeding: None.  Movement: Absent. Denies leaking of fluid.   The following portions of the patient's history were reviewed and updated as appropriate: allergies, current medications, past family history, past medical history, past social history, past surgical history and problem list.   Objective:   Vitals:   06/25/21 1013  BP: 112/75  Pulse: 95  Weight: 154 lb 6.4 oz (70 kg)    Fetal Status: Fetal Heart Rate (bpm): 158   Movement: Absent     General:  Alert, oriented and cooperative. Patient is in no acute distress.  Skin: Skin is warm and dry. No rash noted.   Cardiovascular: Normal heart rate noted  Respiratory: Normal respiratory effort, no problems with respiration noted  Abdomen: Soft, gravid, appropriate for gestational age.  Pain/Pressure: Absent     Pelvic: Cervical exam deferred        Extremities: Normal range of motion.  Edema: None  Mental Status: Normal mood and affect. Normal behavior. Normal judgment and thought content.   Assessment and Plan:  Pregnancy: [id number] at [redacted]w[redacted]d 1. [redacted] weeks gestation of pregnancy - [place] MFM OB COMP + 14 WK; Future - Cont PNV  2. Rubella non-immune status, antepartum - recommended post partum vaccination  3. Nausea/vomiting in pregnancy - will try diclegis.  Rx to pharmacy.  Instructions on taking and titatring up dosage reviewed - Doxylamine-Pyridoxine (DICLEGIS) 10-10 MG TBEC; 2 tabs at  night x 2 nights.  On day 3, add on tab in am with 2 at night.  On day 4, can add one in the afternoon as well.  Dispense: 60 tablet; Refill: 1  4. Supervision of other normal pregnancy, antepartum    Preterm labor symptoms and general obstetric precautions including but not limited to vaginal bleeding, contractions, leaking of fluid and fetal movement were reviewed in detail with the patient. Please refer to After Visit Summary for other counseling recommendations.   Return in about 4 weeks (around 07/23/2021).  Future Appointments  Date Time Provider Department Center  08/07/2021 10:45 AM [facility] US5 [facility]    Jerene Bears, MD

## 2021-07-16 ENCOUNTER — Encounter (HOSPITAL_BASED_OUTPATIENT_CLINIC_OR_DEPARTMENT_OTHER): Payer: Self-pay | Admitting: Obstetrics & Gynecology

## 2021-07-17 ENCOUNTER — Other Ambulatory Visit (HOSPITAL_BASED_OUTPATIENT_CLINIC_OR_DEPARTMENT_OTHER): Payer: Self-pay | Admitting: Obstetrics & Gynecology

## 2021-07-17 DIAGNOSIS — O219 Vomiting of pregnancy, unspecified: Secondary | ICD-10-CM

## 2021-07-17 MED ORDER — DOXYLAMINE-PYRIDOXINE 10-10 MG PO TBEC: DELAYED_RELEASE_TABLET | ORAL | 1 refills | Status: DC

## 2021-08-07 ENCOUNTER — Other Ambulatory Visit: Payer: Self-pay

## 2021-08-07 ENCOUNTER — Ambulatory Visit: Payer: Medicaid Other | Attending: Obstetrics & Gynecology

## 2021-08-07 DIAGNOSIS — Z3A19 19 weeks gestation of pregnancy: Secondary | ICD-10-CM | POA: Insufficient documentation

## 2021-08-07 DIAGNOSIS — Z363 Encounter for antenatal screening for malformations: Secondary | ICD-10-CM | POA: Diagnosis not present

## 2021-08-07 DIAGNOSIS — Z3689 Encounter for other specified antenatal screening: Secondary | ICD-10-CM | POA: Diagnosis not present

## 2021-08-07 DIAGNOSIS — O359XX0 Maternal care for (suspected) fetal abnormality and damage, unspecified, not applicable or unspecified: Secondary | ICD-10-CM | POA: Diagnosis present

## 2021-08-07 DIAGNOSIS — Z3A12 12 weeks gestation of pregnancy: Secondary | ICD-10-CM

## 2021-08-10 ENCOUNTER — Encounter (HOSPITAL_BASED_OUTPATIENT_CLINIC_OR_DEPARTMENT_OTHER): Payer: Medicaid Other | Admitting: Medical

## 2021-08-13 ENCOUNTER — Encounter (HOSPITAL_BASED_OUTPATIENT_CLINIC_OR_DEPARTMENT_OTHER): Payer: Self-pay | Admitting: Obstetrics & Gynecology

## 2021-08-13 ENCOUNTER — Ambulatory Visit (INDEPENDENT_AMBULATORY_CARE_PROVIDER_SITE_OTHER): Payer: Medicaid Other | Admitting: Obstetrics & Gynecology

## 2021-08-13 ENCOUNTER — Other Ambulatory Visit: Payer: Self-pay

## 2021-08-13 VITALS — BP 110/70 | HR 76 | Wt 156.6 lb

## 2021-08-13 DIAGNOSIS — Z2839 Other underimmunization status: Secondary | ICD-10-CM

## 2021-08-13 DIAGNOSIS — Z3482 Encounter for supervision of other normal pregnancy, second trimester: Secondary | ICD-10-CM | POA: Diagnosis not present

## 2021-08-13 DIAGNOSIS — Z3A19 19 weeks gestation of pregnancy: Secondary | ICD-10-CM | POA: Diagnosis not present

## 2021-08-13 DIAGNOSIS — Z3480 Encounter for supervision of other normal pregnancy, unspecified trimester: Secondary | ICD-10-CM

## 2021-08-13 DIAGNOSIS — O09899 Supervision of other high risk pregnancies, unspecified trimester: Secondary | ICD-10-CM

## 2021-08-13 DIAGNOSIS — O09892 Supervision of other high risk pregnancies, second trimester: Secondary | ICD-10-CM

## 2021-08-15 ENCOUNTER — Encounter (HOSPITAL_BASED_OUTPATIENT_CLINIC_OR_DEPARTMENT_OTHER): Payer: Self-pay | Admitting: Obstetrics & Gynecology

## 2021-08-15 LAB — AFP, SERUM, OPEN SPINA BIFIDA
AFP MoM: 1.14
AFP Value: 67.7 ng/mL
Gest. Age on Collection Date: 19.6 weeks
Maternal Age At EDD: 29 yr
OSBR Risk 1 IN: 10000
Test Results:: NEGATIVE
Weight: 156 [lb_av]

## 2021-08-15 NOTE — Progress Notes (Signed)
? ?  PRENATAL VISIT NOTE ? ?Subjective:  ?Elizabeth [name] is a 31 y.o. [phone] at [redacted]w[redacted]d being seen today for ongoing prenatal care.  She is currently monitored for the following issues for this low-risk pregnancy and has Supervision of other normal pregnancy, antepartum and Rubella non-immune status, antepartum on their problem list. ? ?Patient reports no complaints.  Contractions: Not present. Vag. Bleeding: None.  Movement: Absent. Denies leaking of fluid.  ? ?The following portions of the patient's history were reviewed and updated as appropriate: allergies, current medications, past family history, past medical history, past social history, past surgical history and problem list.  ? ?Objective:  ? ?Vitals:  ? 08/13/21 1025  ?BP: 110/70  ?Pulse: 76  ?Weight: 156 lb 9.6 oz (71 kg)  ? ? ?Fetal Status: Fetal Heart Rate (bpm): 155   Movement: Absent    ? ?General:  Alert, oriented and cooperative. Patient is in no acute distress.  ?Skin: Skin is warm and dry. No rash noted.   ?Cardiovascular: Normal heart rate noted  ?Respiratory: Normal respiratory effort, no problems with respiration noted  ?Abdomen: Soft, gravid, appropriate for gestational age.  Pain/Pressure: Absent     ?Pelvic: Cervical exam deferred        ?Extremities: Normal range of motion.  Edema: None  ?Mental Status: Normal mood and affect. Normal behavior. Normal judgment and thought content.  ? ?Assessment and Plan:  ?Pregnancy: [id number] at [redacted]w[redacted]d ?1. [redacted] weeks gestation of pregnancy ?- on PNV ?- AFP, Serum, Open Spina Bifida ?- reviewed normal anatomy scan with pt ? ?2. Supervision of other normal pregnancy, antepartum ? ?3.  Rubella non-immune ? ?Preterm labor symptoms and general obstetric precautions including but not limited to vaginal bleeding, contractions, leaking of fluid and fetal movement were reviewed in detail with the patient. ?Please refer to After Visit Summary for other counseling recommendations.  ? ?Return in about 4 weeks (around  09/10/2021). ? ?Future Appointments  ?Date Time Provider [place]  ?09/11/2021  9:00 AM Megan Salon, MD [facility]  ?10/09/2021  8:45 AM Elvera Maria, CNM [facility]  ?10/23/2021 10:45 AM Amedeo Gory, Kathie Dike, CNM [facility]  ?11/06/2021 10:45 AM Leftwich-Kirby, Kathie Dike, CNM [facility]  ? ? ?Megan Salon, MD ? ?

## 2021-08-17 ENCOUNTER — Inpatient Hospital Stay (HOSPITAL_COMMUNITY)
Admission: AD | Admit: 2021-08-17 | Discharge: 2021-08-17 | Disposition: A | Discharge status: Home / Self Care | Payer: Medicaid Other | Attending: Obstetrics and Gynecology | Admitting: Obstetrics and Gynecology

## 2021-08-17 ENCOUNTER — Encounter (HOSPITAL_COMMUNITY): Payer: Self-pay | Admitting: Obstetrics and Gynecology

## 2021-08-17 DIAGNOSIS — Z3A20 20 weeks gestation of pregnancy: Secondary | ICD-10-CM | POA: Insufficient documentation

## 2021-08-17 DIAGNOSIS — O26892 Other specified pregnancy related conditions, second trimester: Secondary | ICD-10-CM | POA: Insufficient documentation

## 2021-08-17 DIAGNOSIS — R519 Headache, unspecified: Secondary | ICD-10-CM | POA: Diagnosis not present

## 2021-08-17 DIAGNOSIS — Z3480 Encounter for supervision of other normal pregnancy, unspecified trimester: Secondary | ICD-10-CM

## 2021-08-17 LAB — URINALYSIS, ROUTINE W REFLEX MICROSCOPIC
Bilirubin Urine: NEGATIVE
Glucose, UA: NEGATIVE mg/dL
Hgb urine dipstick: NEGATIVE
Ketones, ur: NEGATIVE mg/dL
Leukocytes,Ua: NEGATIVE
Nitrite: NEGATIVE
Protein, ur: NEGATIVE mg/dL
Specific Gravity, Urine: 1.012 (ref 1.005–1.030)
pH: 7 (ref 5.0–8.0)

## 2021-08-17 MED ORDER — METOCLOPRAMIDE HCL 5 MG/ML IJ SOLN
10.0000 mg | Freq: Once | INTRAMUSCULAR | Status: AC
  Administered 2021-08-17: 10 mg via INTRAVENOUS
  Filled 2021-08-17: qty 2

## 2021-08-17 MED ORDER — DIPHENHYDRAMINE HCL 50 MG/ML IJ SOLN
25.0000 mg | Freq: Once | INTRAMUSCULAR | Status: AC
  Administered 2021-08-17: 25 mg via INTRAVENOUS
  Filled 2021-08-17: qty 1

## 2021-08-17 MED ORDER — LACTATED RINGERS IV BOLUS
1000.0000 mL | Freq: Once | INTRAVENOUS | Status: AC
Start: 2021-08-17 — End: 2021-08-17
  Administered 2021-08-17: 1000 mL via INTRAVENOUS

## 2021-08-17 NOTE — MAU Note (Signed)
.  Elizabeth [name] is a 31 y.o. at [redacted]w[redacted]d here in MAU reporting: she has had a migraine x 1 week. Has taken tylenol and Excedrin without much relief. Pain is 10/10 when the medication wears off and 5/10 with meds. Sensitve to light and sound. ? ?Onset of complaint: 1 week ?Pain score: 5/10( after taking Excedrin at 9am) ?There were no vitals filed for this visit.   ?FHT:148 ? ?Lab orders placed from triage:    ?

## 2021-08-17 NOTE — MAU Provider Note (Signed)
?History  ?  ? ?CSN: [id number] ? ?Arrival date and time: 08/17/21 0934 ? ? None  ?  ? ?Chief Complaint  ?Patient presents with  ? Headache  ? ?HPI ?Elizabeth [name] is a 31 y.o. [id number] at [redacted]w[redacted]d who presents to MAU for headache. Patient reports ongoing headache for the past week that started after a sinus infection. She reports she has taken Tylenol and Excedrin for headaches. The Excedrin does help some, but headache always returns. She currently rates headache 5/10. Headache is frontal and radiates down temples and into back of neck and shoulders. She describes it as a constant, pulsating throb. Lights, sounds, and movement aggravate headache. She denies nausea, vomiting, fever, congestion, cough, or sore throat. Reports some intermittent dizziness. Has a normal diet. No contractions, vaginal bleeding, or leaking fluid. Endorses active fetal movement. ? ?Receives prenatal care at [place]. Pregnancy has been uncomplicated thus far. ? ? ?OB History   ? ? Gravida  ?6  ? Para  ?3  ? Term  ?3  ? Preterm  ?0  ? AB  ?2  ? Living  ?3  ?  ? ? SAB  ?0  ? IAB  ?1  ? Ectopic  ?1  ? Multiple  ?0  ? Live Births  ?3  ?   ?  ?  ? ? ?Past Medical History:  ?Diagnosis Date  ? History of ectopic pregnancy   ? treated with methotrexate  ? ? ?Past Surgical History:  ?Procedure Laterality Date  ? THERAPEUTIC ABORTION    ? ? ?Family History  ?Problem Relation Age of Onset  ? Hearing loss Mother   ? Thyroid disease Mother   ? Anesthesia problems Neg Hx   ? ? ?Social History  ? ?Tobacco Use  ? Smoking status: Former  ?  Types: Cigarettes  ?  Quit date: 06/02/2014  ?  Years since quitting: 7.2  ? Smokeless tobacco: Never  ?Vaping Use  ? Vaping Use: Never used  ?Substance Use Topics  ? Alcohol use: Yes  ?  Comment: socially  ? Drug use: No  ? ? ?Allergies: No Known Allergies ? ?Medications Prior to Admission  ?Medication Sig Dispense Refill Last Dose  ? acetaminophen (TYLENOL) 500 MG tablet Take 500 mg by mouth every 6 (six) hours as needed.    Past Week  ? aspirin-acetaminophen-caffeine (EXCEDRIN MIGRAINE) 250-250-65 MG tablet Take 2 tablets by mouth every 6 (six) hours as needed for headache.   08/17/2021 at 0900  ? ondansetron (ZOFRAN-ODT) 8 MG disintegrating tablet Take 1 tablet (8 mg total) by mouth every 8 (eight) hours as needed for nausea. 20 tablet 3 Past Week  ? Prenatal Vit-Fe Fumarate-FA (PRENATAL VITAMINS) 28-0.8 MG TABS 1 tab by mouth daily with food. 30 tablet 10 08/16/2021  ? Doxylamine-Pyridoxine (DICLEGIS) 10-10 MG TBEC 2 tabs at night x 2 nights.  On day 3, add on tab in am with 2 at night.  On day 4, can add one in the afternoon as well. 120 tablet 1   ? ?Review of Systems  ?Constitutional: Negative.   ?Respiratory: Negative.    ?Cardiovascular: Negative.   ?Gastrointestinal: Negative.   ?Genitourinary: Negative.   ?Musculoskeletal: Negative.   ?Neurological:  Positive for dizziness (intermittent) and headaches.  ? ?Physical Exam  ? ?Blood pressure (!) 103/45, pulse 78, temperature 98.5 ?F (36.9 ?C), resp. rate 18, height 5\' 4"  (1.626 m), weight 70.8 kg, last menstrual period 03/27/2021, SpO2 100 %. ? ?Physical Exam ?Vitals and  nursing note reviewed.  ?Constitutional:   ?   General: She is not in acute distress. ?   Appearance: She is well-developed.  ?Eyes:  ?   Extraocular Movements: Extraocular movements intact.  ?   Pupils: Pupils are equal, round, and reactive to light.  ?Cardiovascular:  ?   Rate and Rhythm: Normal rate.  ?Pulmonary:  ?   Effort: Pulmonary effort is normal.  ?Abdominal:  ?   Palpations: Abdomen is soft.  ?Musculoskeletal:     ?   General: Normal range of motion.  ?   Cervical back: Normal range of motion.  ?Skin: ?   General: Skin is warm and dry.  ?Neurological:  ?   Mental Status: She is alert and oriented to person, place, and time.  ?   Cranial Nerves: No cranial nerve deficit.  ?   Sensory: No sensory deficit.  ?   Motor: No weakness.  ?Psychiatric:     ?   Mood and Affect: Mood normal.     ?   Speech: Speech  normal.     ?   Behavior: Behavior normal.  ? ?FHR: 148 bpm via doppler ? ?MAU Course  ?Procedures ?UA ?IVF bolus with IV Reglan and Benadryl ? ?MDM ?UA unremarkable. VSS. FHR detected via doppler. Patient was given IVF bolus with reglan and benadryl. Headache completely resolved after medication. Recommend discontinuing any Excedrin products that contain aspirin. May continue to use Tylenol prn. Ensure adequate hydration and regular meals/snacks as well as adequate sleep.  ? ?Assessment and Plan  ?[redacted] weeks gestation of pregnancy ?Headache ? ?- Discharge home in stable condition ?- Strict return precautions reviewed ?- Return to MAU sooner or as needed for worsening symptoms ?- Keep OB appointment as scheduled ? ? ?Renee Harder, CNM ?08/17/2021, 12:54 PM  ?

## 2021-09-11 ENCOUNTER — Ambulatory Visit (INDEPENDENT_AMBULATORY_CARE_PROVIDER_SITE_OTHER): Payer: Medicaid Other | Admitting: Obstetrics & Gynecology

## 2021-09-11 VITALS — BP 113/74 | HR 92 | Wt 159.4 lb

## 2021-09-11 DIAGNOSIS — O09892 Supervision of other high risk pregnancies, second trimester: Secondary | ICD-10-CM

## 2021-09-11 DIAGNOSIS — Z3A24 24 weeks gestation of pregnancy: Secondary | ICD-10-CM

## 2021-09-11 DIAGNOSIS — Z3490 Encounter for supervision of normal pregnancy, unspecified, unspecified trimester: Secondary | ICD-10-CM

## 2021-09-11 DIAGNOSIS — Z3480 Encounter for supervision of other normal pregnancy, unspecified trimester: Secondary | ICD-10-CM

## 2021-09-11 DIAGNOSIS — Z2839 Other underimmunization status: Secondary | ICD-10-CM

## 2021-09-11 DIAGNOSIS — Z3482 Encounter for supervision of other normal pregnancy, second trimester: Secondary | ICD-10-CM

## 2021-09-11 NOTE — Progress Notes (Signed)
? ?  PRENATAL VISIT NOTE ? ?Subjective:  ?Elizabeth [name] is a 31 y.o. [phone] at [redacted]w[redacted]d being seen today for ongoing prenatal care.  She is currently monitored for the following issues for this low-risk pregnancy and has Supervision of other normal pregnancy, antepartum and Rubella non-immune status, antepartum on their problem list. ? ?Patient reports no complaints.  Contractions: Not present. Vag. Bleeding: None.  Movement: Present. Denies leaking of fluid.  ? ?The following portions of the patient's history were reviewed and updated as appropriate: allergies, current medications, past family history, past medical history, past social history, past surgical history and problem list.  ? ?Objective:  ? ?Vitals:  ? 09/11/21 0900  ?BP: 113/74  ?Pulse: 92  ?Weight: 159 lb 6.4 oz (72.3 kg)  ? ? ?Fetal Status: Fetal Heart Rate (bpm): 150 Fundal Height: 24 cm Movement: Present    ? ?General:  Alert, oriented and cooperative. Patient is in no acute distress.  ?Skin: Skin is warm and dry. No rash noted.   ?Cardiovascular: Normal heart rate noted  ?Respiratory: Normal respiratory effort, no problems with respiration noted  ?Abdomen: Soft, gravid, appropriate for gestational age.  Pain/Pressure: Absent     ?Pelvic: Cervical exam deferred        ?Extremities: Normal range of motion.  Edema: None  ?Mental Status: Normal mood and affect. Normal behavior. Normal judgment and thought content.  ? ?Assessment and Plan:  ?Pregnancy: [id number] at [redacted]w[redacted]d ?1. [redacted] weeks gestation of pregnancy ?- on PNV ?- recheck 4 weeks.  Plan 3rd trimester labs and GTT then. ?- plan tdap next visit ?- considering water birth.  Has scheduled class for 4/24.  Knows to send message with certificate printed ? ?2. Supervision of other normal pregnancy, antepartum ?- consider pp BTL.  Will plan to sign papers a little later in pregnancy. ? ?3. Rubella non-immune status, antepartum ?- will updated vaccination PP ? ?Preterm labor symptoms and general obstetric  precautions including but not limited to vaginal bleeding, contractions, leaking of fluid and fetal movement were reviewed in detail with the patient. ?Please refer to After Visit Summary for other counseling recommendations.  ? ? ?Future Appointments  ?Date Time Provider Department Center  ?10/09/2021  8:00 AM [facility]  ?10/09/2021  8:45 AM Leftwich-Kirby, Wilmer Floor, CNM [facility]  ?10/23/2021 10:45 AM Leftwich-Kirby, Wilmer Floor, CNM [facility]  ?11/08/2021 10:00 AM Jerene Bears, MD [facility]  ?11/20/2021  9:45 AM Courtney Paris, Wilmer Floor, CNM [facility]  ?12/04/2021 10:00 AM Leftwich-Kirby, Wilmer Floor, CNM [facility]  ?12/10/2021 10:00 AM Jerene Bears, MD [facility]  ?12/17/2021 11:45 AM Jerene Bears, MD [facility]  ?12/24/2021 11:30 AM Jerene Bears, MD [facility]  ?12/31/2021 11:45 AM Jerene Bears, MD [facility]  ? ? ?Jerene Bears, MD  ?

## 2021-10-08 NOTE — Progress Notes (Signed)
? ?  PRENATAL VISIT NOTE ? ?Subjective:  ?Elizabeth [name] is a 31 y.o. [phone] at [redacted]w[redacted]d being seen today for ongoing prenatal care.  She is currently monitored for the following issues for this low-risk pregnancy and has Supervision of other normal pregnancy, antepartum and Rubella non-immune status, antepartum on their problem list. ? ?Patient reports no complaints.  Contractions: Not present. Vag. Bleeding: None.  Movement: Present. Denies leaking of fluid.  ? ?The following portions of the patient's history were reviewed and updated as appropriate: allergies, current medications, past family history, past medical history, past social history, past surgical history and problem list.  ? ?Objective:  ? ?Vitals:  ? 10/09/21 0845  ?BP: 112/70  ?Pulse: 81  ?Weight: 168 lb 12.8 oz (76.6 kg)  ? ? ?Fetal Status: Fetal Heart Rate (bpm): 150   Movement: Present    ? ?General:  Alert, oriented and cooperative. Patient is in no acute distress.  ?Skin: Skin is warm and dry. No rash noted.   ?Cardiovascular: Normal heart rate noted  ?Respiratory: Normal respiratory effort, no problems with respiration noted  ?Abdomen: Soft, gravid, appropriate for gestational age.  Pain/Pressure: Absent     ?Pelvic: Cervical exam deferred        ?Extremities: Normal range of motion.  Edema: Trace  ?Mental Status: Normal mood and affect. Normal behavior. Normal judgment and thought content.  ? ?Assessment and Plan:  ?Pregnancy: [id number] at [redacted]w[redacted]d ?1. Supervision of other normal pregnancy, antepartum ?--Anticipatory guidance about next visits/weeks of pregnancy given.  ?--GTT, 28 week labs today ? ?- Pt interested in waterbirth and has attended the class.  ?- Reviewed conditions in labor that will risk her out of water immersion including thick meconium or blood stained amniotic fluid, non-reassuring fetal status on monitor, excessive bleeding, hypertension, dizziness, use of IV meds, damaged equipment or staffing that does not allow for water  immersion, etc.  ?- The attending midwife must be on the unit for water immersion to begin; pt understands this may delay the start of water immersion. ?- Reminded pt that signing consent in labor at the hospital also acknowledges they will exit the tub if the attending midwife requests. ?- Consent given to patient for review.  Consent will be reviewed and signed at the hospital by the waterbirth provider prior to use of the tub. ?- Discussed other labor support options if waterbirth becomes unavailable, including position change, freedom of movement, use of birthing ball, and/or use of hydrotherapy in the shower (dependent upon medical condition/provider discretion).   ? ?2. [redacted] weeks gestation of pregnancy ? ? ?Preterm labor symptoms and general obstetric precautions including but not limited to vaginal bleeding, contractions, leaking of fluid and fetal movement were reviewed in detail with the patient. ?Please refer to After Visit Summary for other counseling recommendations.  ? ?Return in about 2 weeks (around 10/23/2021) for Midwife preferred, desires waterbirth, LOB. ? ?Future Appointments  ?Date Time Provider Department Center  ?10/23/2021 10:45 AM Leftwich-Kirby, Wilmer Floor, CNM [facility]  ?11/08/2021 10:00 AM Jerene Bears, MD [facility]  ?11/20/2021  9:45 AM Courtney Paris, Wilmer Floor, CNM [facility]  ?12/04/2021 10:00 AM Leftwich-Kirby, Wilmer Floor, CNM [facility]  ?12/10/2021 10:00 AM Jerene Bears, MD [facility]  ?12/17/2021 11:45 AM Jerene Bears, MD [facility]  ?12/24/2021 11:30 AM Jerene Bears, MD [facility]  ?12/31/2021 11:45 AM Jerene Bears, MD [facility]  ? ? ?Sharen Counter, CNM  ?

## 2021-10-09 ENCOUNTER — Ambulatory Visit (INDEPENDENT_AMBULATORY_CARE_PROVIDER_SITE_OTHER): Payer: Medicaid Other | Admitting: Advanced Practice Midwife

## 2021-10-09 ENCOUNTER — Other Ambulatory Visit (HOSPITAL_BASED_OUTPATIENT_CLINIC_OR_DEPARTMENT_OTHER): Payer: Medicaid Other

## 2021-10-09 VITALS — BP 112/70 | HR 81 | Wt 168.8 lb

## 2021-10-09 DIAGNOSIS — Z3482 Encounter for supervision of other normal pregnancy, second trimester: Secondary | ICD-10-CM | POA: Diagnosis not present

## 2021-10-09 DIAGNOSIS — Z3480 Encounter for supervision of other normal pregnancy, unspecified trimester: Secondary | ICD-10-CM

## 2021-10-09 DIAGNOSIS — Z3A27 27 weeks gestation of pregnancy: Secondary | ICD-10-CM

## 2021-10-09 NOTE — Progress Notes (Signed)
Patient is here for GTT bloodwork. tbw ?

## 2021-10-09 NOTE — Patient Instructions (Signed)

## 2021-10-10 ENCOUNTER — Other Ambulatory Visit: Payer: Self-pay | Admitting: Advanced Practice Midwife

## 2021-10-10 ENCOUNTER — Encounter (HOSPITAL_BASED_OUTPATIENT_CLINIC_OR_DEPARTMENT_OTHER): Payer: Self-pay | Admitting: Advanced Practice Midwife

## 2021-10-10 DIAGNOSIS — O99013 Anemia complicating pregnancy, third trimester: Secondary | ICD-10-CM

## 2021-10-10 DIAGNOSIS — Z3480 Encounter for supervision of other normal pregnancy, unspecified trimester: Secondary | ICD-10-CM

## 2021-10-10 LAB — GLUCOSE TOLERANCE, 2 HOURS W/ 1HR
Glucose, 1 hour: 136 mg/dL (ref 70–179)
Glucose, 2 hour: 111 mg/dL (ref 70–152)
Glucose, Fasting: 89 mg/dL (ref 70–91)

## 2021-10-10 LAB — CBC
Hematocrit: 30 % — ABNORMAL LOW (ref 34.0–46.6)
Hemoglobin: 10.3 g/dL — ABNORMAL LOW (ref 11.1–15.9)
MCH: 29.3 pg (ref 26.6–33.0)
MCHC: 34.3 g/dL (ref 31.5–35.7)
MCV: 86 fL (ref 79–97)
Platelets: 252 10*3/uL (ref 150–450)
RBC: 3.51 x10E6/uL — ABNORMAL LOW (ref 3.77–5.28)
RDW: 12.3 % (ref 11.7–15.4)
WBC: 12.7 10*3/uL — ABNORMAL HIGH (ref 3.4–10.8)

## 2021-10-10 LAB — RPR: RPR Ser Ql: NONREACTIVE

## 2021-10-10 LAB — HIV ANTIBODY (ROUTINE TESTING W REFLEX): HIV Screen 4th Generation wRfx: NONREACTIVE

## 2021-10-10 MED ORDER — FERROUS SULFATE 325 (65 FE) MG PO TABS: 325.0000 mg | ORAL_TABLET | ORAL | 2 refills | Status: AC

## 2021-10-23 ENCOUNTER — Telehealth (INDEPENDENT_AMBULATORY_CARE_PROVIDER_SITE_OTHER): Payer: Medicaid Other | Admitting: Advanced Practice Midwife

## 2021-10-23 ENCOUNTER — Encounter (HOSPITAL_BASED_OUTPATIENT_CLINIC_OR_DEPARTMENT_OTHER): Payer: Self-pay | Admitting: *Deleted

## 2021-10-23 DIAGNOSIS — R12 Heartburn: Secondary | ICD-10-CM

## 2021-10-23 DIAGNOSIS — Z3009 Encounter for other general counseling and advice on contraception: Secondary | ICD-10-CM

## 2021-10-23 DIAGNOSIS — Z3480 Encounter for supervision of other normal pregnancy, unspecified trimester: Secondary | ICD-10-CM

## 2021-10-23 DIAGNOSIS — Z3A30 30 weeks gestation of pregnancy: Secondary | ICD-10-CM

## 2021-10-23 DIAGNOSIS — O26893 Other specified pregnancy related conditions, third trimester: Secondary | ICD-10-CM

## 2021-10-23 MED ORDER — PANTOPRAZOLE SODIUM 40 MG PO TBEC: 40.0000 mg | DELAYED_RELEASE_TABLET | Freq: Every day | ORAL | 2 refills | Status: DC

## 2021-10-23 NOTE — Progress Notes (Signed)
? ?  OBSTETRICS PRENATAL VIRTUAL VISIT ENCOUNTER NOTE ? ?Provider location: [facility] [place]  ? ?Patient location: Home ? ?I connected with Teena Dunk on 10/23/21 at 10:45 AM EDT by MyChart Video Encounter and verified that I am speaking with the correct person using two identifiers. I discussed the limitations, risks, security and privacy concerns of performing an evaluation and management service virtually and the availability of in person appointments. I also discussed with the patient that there may be a patient responsible charge related to this service. The patient expressed understanding and agreed to proceed. ?Subjective:  ?Elizabeth [name] is a 31 y.o. [phone] at [redacted]w[redacted]d being seen today for ongoing prenatal care.  She is currently monitored for the following issues for this low-risk pregnancy and has Supervision of other normal pregnancy, antepartum and Rubella non-immune status, antepartum on their problem list. ? ?Patient reports heartburn.  Contractions: Not present.  .  Movement: Present. Denies any leaking of fluid.  ? ?The following portions of the patient's history were reviewed and updated as appropriate: allergies, current medications, past family history, past medical history, past social history, past surgical history and problem list.  ? ?Objective:  ?There were no vitals filed for this visit. ? ?Fetal Status:     Movement: Present    ? ?General:  Alert, oriented and cooperative. Patient is in no acute distress.  ?Respiratory: Normal respiratory effort, no problems with respiration noted  ?Mental Status: Normal mood and affect. Normal behavior. Normal judgment and thought content.  ?Rest of physical exam deferred due to type of encounter ? ?Imaging: ?No results found. ? ?Assessment and Plan:  ?Pregnancy: [id number] at [redacted]w[redacted]d ?1. Heartburn during pregnancy in third trimester ?--Daily Pepcid is not working ?- pantoprazole (PROTONIX) 40 MG tablet; Take 1 tablet (40  mg total) by mouth daily.  Dispense: 30 tablet; Refill: 2 ? ?2. Supervision of other normal pregnancy, antepartum ?--Pt reports good fetal movement, denies cramping, LOF, or vaginal bleeding  ?--Anticipatory guidance about next visits/weeks of pregnancy given.  ? ?3. [redacted] weeks gestation of pregnancy ? ? ?4. Unwanted fertility ?--Desires BTL.  Needs to sign BTL papers at next visit  ? ?Preterm labor symptoms and general obstetric precautions including but not limited to vaginal bleeding, contractions, leaking of fluid and fetal movement were reviewed in detail with the patient. ?I discussed the assessment and treatment plan with the patient. The patient was provided an opportunity to ask questions and all were answered. The patient agreed with the plan and demonstrated an understanding of the instructions. The patient was advised to call back or seek an in-person office evaluation/go to MAU at [facility] for any urgent or concerning symptoms. ?Please refer to After Visit Summary for other counseling recommendations.  ? ?I provided 7 minutes of face-to-face time during this encounter. ? ?No follow-ups on file. ? ?Future Appointments  ?Date Time Provider Department Center  ?11/08/2021 10:00 AM Jerene Bears, MD [facility]  ?11/20/2021  9:45 AM Courtney Paris, Wilmer Floor, CNM [facility]  ?12/04/2021 10:00 AM Leftwich-Kirby, Wilmer Floor, CNM [facility]  ?12/10/2021 10:00 AM Jerene Bears, MD [facility]  ?12/17/2021 11:45 AM Jerene Bears, MD [facility]  ?12/24/2021 11:30 AM Jerene Bears, MD [facility]  ?12/31/2021 11:45 AM Jerene Bears, MD [facility]  ? ? ?Sharen Counter, CNM ?[facility] [employer] [facility], [facility]  ?

## 2021-11-06 ENCOUNTER — Encounter (HOSPITAL_BASED_OUTPATIENT_CLINIC_OR_DEPARTMENT_OTHER): Payer: Medicaid Other | Admitting: Advanced Practice Midwife

## 2021-11-08 ENCOUNTER — Telehealth (HOSPITAL_BASED_OUTPATIENT_CLINIC_OR_DEPARTMENT_OTHER): Payer: Self-pay | Admitting: Obstetrics & Gynecology

## 2021-11-08 ENCOUNTER — Telehealth (INDEPENDENT_AMBULATORY_CARE_PROVIDER_SITE_OTHER): Payer: Medicaid Other | Admitting: Obstetrics & Gynecology

## 2021-11-08 DIAGNOSIS — Z3A32 32 weeks gestation of pregnancy: Secondary | ICD-10-CM

## 2021-11-08 DIAGNOSIS — Z3480 Encounter for supervision of other normal pregnancy, unspecified trimester: Secondary | ICD-10-CM

## 2021-11-08 DIAGNOSIS — Z2839 Other underimmunization status: Secondary | ICD-10-CM

## 2021-11-08 DIAGNOSIS — O09893 Supervision of other high risk pregnancies, third trimester: Secondary | ICD-10-CM

## 2021-11-08 DIAGNOSIS — K219 Gastro-esophageal reflux disease without esophagitis: Secondary | ICD-10-CM

## 2021-11-08 DIAGNOSIS — Z3483 Encounter for supervision of other normal pregnancy, third trimester: Secondary | ICD-10-CM

## 2021-11-08 NOTE — Telephone Encounter (Signed)
Called patient and left a message to call the office.

## 2021-11-08 NOTE — Progress Notes (Signed)
I connected with Elizabeth [name] 11/08/21 at 10:00 AM EDT by: MyChart video and verified that I am speaking with the correct person using two identifiers.  Patient is located at home and provider is located at home.     I discussed the limitations, risks, security and privacy concerns of performing an evaluation and management service by MyChart video and the availability of in person appointments. I also discussed with the patient that there may be a patient responsible charge related to this service. By engaging in this virtual visit, you consent to the provision of healthcare.  Additionally, you authorize for your insurance to be billed for the services provided during this visit.  The patient expressed understanding and agreed to proceed.  The following staff members participated in the virtual visit:  Raechel Ache, RN    PRENATAL VISIT NOTE  Subjective:  Elizabeth [name] is a 31 y.o. [phone] at [redacted]w[redacted]d  for virtual visit for ongoing prenatal care.  She is currently monitored for the following issues for this low-risk pregnancy and has Supervision of other normal pregnancy, antepartum and Rubella non-immune status, antepartum on their problem list.  Patient reports no complaints.  Contractions: Not present. Vag. Bleeding: None.  Movement: Present. Denies leaking of fluid.   The following portions of the patient's history were reviewed and updated as appropriate: allergies, current medications, past family history, past medical history, past social history, past surgical history and problem list.   Objective:  There were no vitals filed for this visit. Self-Obtained.  Asked pt to find BP cuff and try and enter some blood pressure results into baby scripts. (Has not entered any data to date)  Fetal Status:     Movement: Present     Assessment and Plan:  Pregnancy: Y1P5093 at [redacted]w[redacted]d 1. [redacted] weeks gestation of pregnancy - on PNV - recheck 2 weeks - hopeful for water birth - wants BTL  after post partum time frame.  Needs to sign Medicaid papers.  Discussed with pt to do at next in person visit.  2. Supervision of other normal pregnancy, antepartum  3. Rubella non-immune status, antepartum  4. GERD without esophagitis -on protonix  Preterm labor symptoms and general obstetric precautions including but not limited to vaginal bleeding, contractions, leaking of fluid and fetal movement were reviewed in detail with the patient.  Return in about 2 weeks (around 11/22/2021).  Future Appointments  Date Time Provider Department Center  11/20/2021  9:45 AM Leftwich-Kirby, Wilmer Floor, CNM [facility]  12/04/2021 10:00 AM Leftwich-Kirby, Wilmer Floor, CNM [facility]  12/10/2021 10:00 AM Jerene Bears, MD [facility]  12/17/2021 11:45 AM Jerene Bears, MD [facility]  12/24/2021 11:30 AM Jerene Bears, MD [facility]  12/31/2021 11:45 AM Jerene Bears, MD [facility]     Time spent on virtual visit: 12 minutes  Jerene Bears, MD

## 2021-11-20 ENCOUNTER — Ambulatory Visit (INDEPENDENT_AMBULATORY_CARE_PROVIDER_SITE_OTHER): Payer: Medicaid Other | Admitting: Advanced Practice Midwife

## 2021-11-20 ENCOUNTER — Encounter (HOSPITAL_BASED_OUTPATIENT_CLINIC_OR_DEPARTMENT_OTHER): Payer: Medicaid Other | Admitting: Advanced Practice Midwife

## 2021-11-20 VITALS — BP 122/74 | HR 97 | Wt 178.2 lb

## 2021-11-20 DIAGNOSIS — Z3A34 34 weeks gestation of pregnancy: Secondary | ICD-10-CM

## 2021-11-20 DIAGNOSIS — Z3009 Encounter for other general counseling and advice on contraception: Secondary | ICD-10-CM

## 2021-11-20 DIAGNOSIS — Z3483 Encounter for supervision of other normal pregnancy, third trimester: Secondary | ICD-10-CM

## 2021-11-20 DIAGNOSIS — Z2839 Other underimmunization status: Secondary | ICD-10-CM

## 2021-11-20 DIAGNOSIS — Z3480 Encounter for supervision of other normal pregnancy, unspecified trimester: Secondary | ICD-10-CM

## 2021-11-20 DIAGNOSIS — O09893 Supervision of other high risk pregnancies, third trimester: Secondary | ICD-10-CM

## 2021-11-20 NOTE — Progress Notes (Signed)
   PRENATAL VISIT NOTE  Subjective:  Elizabeth [name] is a 31 y.o. [phone] at [redacted]w[redacted]d being seen today for ongoing prenatal care.  She is currently monitored for the following issues for this low-risk pregnancy and has Supervision of other normal pregnancy, antepartum and Rubella non-immune status, antepartum on their problem list.  Patient reports no complaints.  Contractions: Not present. Vag. Bleeding: None.  Movement: Present. Denies leaking of fluid.   The following portions of the patient's history were reviewed and updated as appropriate: allergies, current medications, past family history, past medical history, past social history, past surgical history and problem list. Problem list updated.  Objective:   Vitals:   11/20/21 0949  BP: 122/74  Pulse: 97  Weight: 178 lb 3.2 oz (80.8 kg)    Fetal Status: Fetal Heart Rate (bpm): 144 Fundal Height: 34 cm Movement: Present     General:  Alert, oriented and cooperative. Patient is in no acute distress.  Skin: Skin is warm and dry. No rash noted.   Cardiovascular: Normal heart rate noted  Respiratory: Normal respiratory effort, no problems with respiration noted  Abdomen: Soft, gravid, appropriate for gestational age.  Pain/Pressure: Present     Pelvic: Cervical exam deferred        Extremities: Normal range of motion.  Edema: Trace  Mental Status: Normal mood and affect. Normal behavior. Normal judgment and thought content.   Assessment and Plan:  Pregnancy: A2Q3335 at [redacted]w[redacted]d  1. Supervision of other normal pregnancy, antepartum - Routine care, GBS next visit, reviewed impact of GBS + on labor course - Reviewed labor triage, location of MAU - Desires waterbirth. Clarified that husband cannot enter tub, cannot sit on sides of tub, consider stepstool to allow him to lean of side to support Anayelli from behind but out of tub - Discussed hx of quick labor, great candidate for waterbirth at this time. Preemptive discussion that if she  delivers in MAU before she can be moved to L&D, she will not be able to have a waterbirth  2. Unwanted fertility - Considering interval tubal - Consent not signed today, declined by patient  3. Rubella non-immune status, antepartum - MMR postpartum  4. [redacted] weeks gestation of pregnancy   Preterm labor symptoms and general obstetric precautions including but not limited to vaginal bleeding, contractions, leaking of fluid and fetal movement were reviewed in detail with the patient. Please refer to After Visit Summary for other counseling recommendations.  Return in about 2 weeks (around 12/04/2021).  Future Appointments  Date Time Provider [place]  12/04/2021 10:00 AM Elvera Maria, CNM [facility]  12/10/2021 10:00 AM Megan Salon, MD [facility]  12/17/2021 11:45 AM Megan Salon, MD [facility]  12/24/2021 11:30 AM Megan Salon, MD [facility]  12/31/2021 11:45 AM Megan Salon, MD [facility]    Darlina Rumpf, CNM

## 2021-12-04 ENCOUNTER — Other Ambulatory Visit (HOSPITAL_COMMUNITY)
Admission: RE | Admit: 2021-12-04 | Discharge: 2021-12-04 | Disposition: A | Discharge status: Home / Self Care | Payer: Medicaid Other | Source: Ambulatory Visit | Attending: Advanced Practice Midwife | Admitting: Advanced Practice Midwife

## 2021-12-04 ENCOUNTER — Ambulatory Visit (INDEPENDENT_AMBULATORY_CARE_PROVIDER_SITE_OTHER): Payer: Medicaid Other | Admitting: Advanced Practice Midwife

## 2021-12-04 VITALS — BP 129/82 | HR 102 | Wt 178.2 lb

## 2021-12-04 DIAGNOSIS — Z3480 Encounter for supervision of other normal pregnancy, unspecified trimester: Secondary | ICD-10-CM | POA: Diagnosis not present

## 2021-12-04 DIAGNOSIS — Z3A36 36 weeks gestation of pregnancy: Secondary | ICD-10-CM

## 2021-12-04 DIAGNOSIS — Z3483 Encounter for supervision of other normal pregnancy, third trimester: Secondary | ICD-10-CM

## 2021-12-05 LAB — CERVICOVAGINAL ANCILLARY ONLY
Chlamydia: NEGATIVE
Comment: NEGATIVE
Comment: NORMAL
Neisseria Gonorrhea: NEGATIVE

## 2021-12-08 LAB — CULTURE, BETA STREP (GROUP B ONLY): Strep Gp B Culture: NEGATIVE

## 2021-12-10 ENCOUNTER — Ambulatory Visit (INDEPENDENT_AMBULATORY_CARE_PROVIDER_SITE_OTHER): Payer: Medicaid Other | Admitting: Obstetrics & Gynecology

## 2021-12-10 VITALS — BP 122/80 | HR 87 | Wt 179.0 lb

## 2021-12-10 DIAGNOSIS — Z3A37 37 weeks gestation of pregnancy: Secondary | ICD-10-CM

## 2021-12-10 DIAGNOSIS — Z3483 Encounter for supervision of other normal pregnancy, third trimester: Secondary | ICD-10-CM

## 2021-12-10 DIAGNOSIS — Z3480 Encounter for supervision of other normal pregnancy, unspecified trimester: Secondary | ICD-10-CM

## 2021-12-10 DIAGNOSIS — O99013 Anemia complicating pregnancy, third trimester: Secondary | ICD-10-CM

## 2021-12-10 NOTE — Progress Notes (Signed)
   PRENATAL VISIT NOTE  Subjective:  Elizabeth [name] is a 31 y.o. [phone] at [redacted]w[redacted]d being seen today for ongoing prenatal care.  She is currently monitored for the following issues for this low-risk pregnancy and has Anemia affecting pregnancy in third trimester; Supervision of other normal pregnancy, antepartum; and Rubella non-immune status, antepartum on their problem list.  Patient reports no complaints.  Contractions: Not present. Vag. Bleeding: None.  Movement: Present. Denies leaking of fluid.   The following portions of the patient's history were reviewed and updated as appropriate: allergies, current medications, past family history, past medical history, past social history, past surgical history and problem list.   Objective:   Vitals:   12/10/21 1026  BP: 122/80  Pulse: 87  Weight: 179 lb (81.2 kg)    Fetal Status: Fetal Heart Rate (bpm): 141 Fundal Height: 37 cm Movement: Present  Presentation: Vertex  General:  Alert, oriented and cooperative. Patient is in no acute distress.  Skin: Skin is warm and dry. No rash noted.   Cardiovascular: Normal heart rate noted  Respiratory: Normal respiratory effort, no problems with respiration noted  Abdomen: Soft, gravid, appropriate for gestational age.  Pain/Pressure: Present     Pelvic: Cervical exam performed in the presence of a chaperone Dilation: 3.5 Effacement (%): 50 Station: -3  Extremities: Normal range of motion.  Edema: Trace  Mental Status: Normal mood and affect. Normal behavior. Normal judgment and thought content.   Assessment and Plan:  Pregnancy: [id number] at [redacted]w[redacted]d 1. [redacted] weeks gestation of pregnancy - on PNV and iron - recheck 1 week  2. Supervision of other normal pregnancy, antepartum  3. Anemia affecting pregnancy in third trimester   Preterm labor symptoms and general obstetric precautions including but not limited to vaginal bleeding, contractions, leaking of fluid and fetal movement were reviewed in  detail with the patient. Please refer to After Visit Summary for other counseling recommendations.   Return in about 1 week (around 12/17/2021).  Future Appointments  Date Time Provider Department Center  12/17/2021 11:45 AM Jerene Bears, MD [facility]  12/24/2021 11:30 AM Jerene Bears, MD [facility]  12/31/2021 11:45 AM Jerene Bears, MD [facility]    Jerene Bears, MD

## 2021-12-17 ENCOUNTER — Ambulatory Visit (INDEPENDENT_AMBULATORY_CARE_PROVIDER_SITE_OTHER): Payer: Medicaid Other | Admitting: Obstetrics & Gynecology

## 2021-12-17 VITALS — BP 120/73 | HR 90 | Wt 178.8 lb

## 2021-12-17 DIAGNOSIS — Z3A37 37 weeks gestation of pregnancy: Secondary | ICD-10-CM

## 2021-12-17 DIAGNOSIS — B3731 Acute candidiasis of vulva and vagina: Secondary | ICD-10-CM

## 2021-12-17 DIAGNOSIS — Z3480 Encounter for supervision of other normal pregnancy, unspecified trimester: Secondary | ICD-10-CM

## 2021-12-17 DIAGNOSIS — O09893 Supervision of other high risk pregnancies, third trimester: Secondary | ICD-10-CM | POA: Diagnosis not present

## 2021-12-17 DIAGNOSIS — Z2839 Other underimmunization status: Secondary | ICD-10-CM | POA: Diagnosis not present

## 2021-12-17 DIAGNOSIS — Z3483 Encounter for supervision of other normal pregnancy, third trimester: Secondary | ICD-10-CM | POA: Diagnosis not present

## 2021-12-17 DIAGNOSIS — O99013 Anemia complicating pregnancy, third trimester: Secondary | ICD-10-CM | POA: Diagnosis not present

## 2021-12-17 DIAGNOSIS — O09899 Supervision of other high risk pregnancies, unspecified trimester: Secondary | ICD-10-CM

## 2021-12-17 MED ORDER — TERCONAZOLE 0.4 % VA CREA: 1.0000 | TOPICAL_CREAM | Freq: Every day | VAGINAL | 0 refills | Status: DC

## 2021-12-17 NOTE — Progress Notes (Signed)
   PRENATAL VISIT NOTE  Subjective:  Elizabeth [name] is a 31 y.o. [phone] at [redacted]w[redacted]d being seen today for ongoing prenatal care.  She is currently monitored for the following issues for this low-risk pregnancy and has Anemia affecting pregnancy in third trimester; Supervision of other normal pregnancy, antepartum; and Rubella non-immune status, antepartum on their problem list.  Patient reports  occasional contractions and she thought she was going into labor last week .  Contractions: Not present. Vag. Bleeding: None.  Movement: Present. Denies leaking of fluid.   The following portions of the patient's history were reviewed and updated as appropriate: allergies, current medications, past family history, past medical history, past social history, past surgical history and problem list.   Objective:   Vitals:   12/17/21 1154  BP: 120/73  Pulse: 90  Weight: 178 lb 12.8 oz (81.1 kg)    Fetal Status: Fetal Heart Rate (bpm): 143 Fundal Height: 37 cm Movement: Present  Presentation: Vertex  General:  Alert, oriented and cooperative. Patient is in no acute distress.  Skin: Skin is warm and dry. No rash noted.   Cardiovascular: Normal heart rate noted  Respiratory: Normal respiratory effort, no problems with respiration noted  Abdomen: Soft, gravid, appropriate for gestational age.  Pain/Pressure: Present     Pelvic: Cervical exam deferred Dilation: 4 Effacement (%): 40 Station: -3.  White clumpy discharge present on glove after exam  Extremities: Normal range of motion.  Edema: Trace  Mental Status: Normal mood and affect. Normal behavior. Normal judgment and thought content.   Assessment and Plan:  Pregnancy: G8Q7619 at [redacted]w[redacted]d 1. Supervision of other normal pregnancy, antepartum - recheck 1 week - pt desires water birth, has done class and seen CNM  2. [redacted] weeks gestation of pregnancy - on PNV  3. Yeast vaginitis - Rx for Terazol 7, one applicator nightly PV x 7 nights sent to  pharmacy  4. Rubella non-immune status, antepartum - will vaccine post partum  5. Anemia affecting pregnancy in third trimester - on oral iron   Preterm labor symptoms and general obstetric precautions including but not limited to vaginal bleeding, contractions, leaking of fluid and fetal movement were reviewed in detail with the patient. Please refer to After Visit Summary for other counseling recommendations.   No follow-ups on file.  Future Appointments  Date Time Provider Department Center  12/24/2021 11:30 AM Jerene Bears, MD [facility]  12/31/2021 11:45 AM Jerene Bears, MD [facility]    Jerene Bears, MD

## 2021-12-24 ENCOUNTER — Other Ambulatory Visit (HOSPITAL_BASED_OUTPATIENT_CLINIC_OR_DEPARTMENT_OTHER): Payer: Self-pay | Admitting: Obstetrics & Gynecology

## 2021-12-24 ENCOUNTER — Ambulatory Visit (INDEPENDENT_AMBULATORY_CARE_PROVIDER_SITE_OTHER): Payer: Medicaid Other | Admitting: Obstetrics & Gynecology

## 2021-12-24 VITALS — BP 124/79 | HR 90 | Wt 179.6 lb

## 2021-12-24 DIAGNOSIS — O09899 Supervision of other high risk pregnancies, unspecified trimester: Secondary | ICD-10-CM

## 2021-12-24 DIAGNOSIS — O99013 Anemia complicating pregnancy, third trimester: Secondary | ICD-10-CM

## 2021-12-24 DIAGNOSIS — Z3480 Encounter for supervision of other normal pregnancy, unspecified trimester: Secondary | ICD-10-CM

## 2021-12-24 DIAGNOSIS — Z3A38 38 weeks gestation of pregnancy: Secondary | ICD-10-CM

## 2021-12-24 DIAGNOSIS — Z2839 Other underimmunization status: Secondary | ICD-10-CM

## 2021-12-24 DIAGNOSIS — Z3490 Encounter for supervision of normal pregnancy, unspecified, unspecified trimester: Secondary | ICD-10-CM

## 2021-12-24 NOTE — Progress Notes (Signed)
   PRENATAL VISIT NOTE  Subjective:  Elizabeth [name] is a 31 y.o. [phone] at [redacted]w[redacted]d being seen today for ongoing prenatal care.  She is currently monitored for the following issues for this low-risk pregnancy and has Anemia affecting pregnancy in third trimester; Supervision of other normal pregnancy, antepartum; and Rubella non-immune status, antepartum on their problem list.  Patient reports no complaints.  Contractions: Irregular. Vag. Bleeding: None.  Movement: Present. Denies leaking of fluid.   The following portions of the patient's history were reviewed and updated as appropriate: allergies, current medications, past family history, past medical history, past social history, past surgical history and problem list.   Objective:   Vitals:   12/24/21 1200  BP: 124/79  Pulse: 90  Weight: 179 lb 9.6 oz (81.5 kg)    Fetal Status: Fetal Heart Rate (bpm): 150 Fundal Height: 38 cm Movement: Present  Presentation: Vertex  General:  Alert, oriented and cooperative. Patient is in no acute distress.  Skin: Skin is warm and dry. No rash noted.   Cardiovascular: Normal heart rate noted  Respiratory: Normal respiratory effort, no problems with respiration noted  Abdomen: Soft, gravid, appropriate for gestational age.  Pain/Pressure: Present     Pelvic: Cervical exam performed in the presence of a chaperone Dilation: 4.5 Effacement (%): 60 Station: -3  Extremities: Normal range of motion.  Edema: Trace  Mental Status: Normal mood and affect. Normal behavior. Normal judgment and thought content.   Assessment and Plan:  Pregnancy: K5L9767 at [redacted]w[redacted]d 1. [redacted] weeks gestation of pregnancy - recheck 1 week - pt and I discussed induction vs waiting due to desires water birth.  Pt desires induction on due date.  Hopefully will deliver prior to that time as she is 4 1/2 cm today. - Induction orders placed and pt scheduled for  induction on 01/01/2022.  Pt understands that she may need continuous  monitoring and water birth not possible.  She is comfortable with plan.  2. Supervision of other normal pregnancy, antepartum  3. Anemia affecting pregnancy in third trimester - on oral iron  4. Rubella non-immune status, antepartum - did receive Rhogram in pregnancy  Preterm labor symptoms and general obstetric precautions including but not limited to vaginal bleeding, contractions, leaking of fluid and fetal movement were reviewed in detail with the patient. Please refer to After Visit Summary for other counseling recommendations.   Return in about 1 week (around 12/31/2021).  Future Appointments  Date Time Provider Department Center  12/31/2021 11:45 AM Jerene Bears, MD [facility]    Jerene Bears, MD

## 2021-12-29 ENCOUNTER — Inpatient Hospital Stay (HOSPITAL_COMMUNITY)
Admission: AD | Admit: 2021-12-29 | Discharge: 2021-12-30 | DRG: 807 | Disposition: A | Discharge status: Home / Self Care | Payer: Medicaid Other | Attending: Obstetrics and Gynecology | Admitting: Obstetrics and Gynecology

## 2021-12-29 DIAGNOSIS — Z3493 Encounter for supervision of normal pregnancy, unspecified, third trimester: Secondary | ICD-10-CM

## 2021-12-29 DIAGNOSIS — Z3A39 39 weeks gestation of pregnancy: Secondary | ICD-10-CM | POA: Diagnosis not present

## 2021-12-29 DIAGNOSIS — Z3490 Encounter for supervision of normal pregnancy, unspecified, unspecified trimester: Secondary | ICD-10-CM

## 2021-12-29 LAB — CBC
HCT: 30.1 % — ABNORMAL LOW (ref 36.0–46.0)
Hemoglobin: 10.1 g/dL — ABNORMAL LOW (ref 12.0–15.0)
MCH: 28.1 pg (ref 26.0–34.0)
MCHC: 33.6 g/dL (ref 30.0–36.0)
MCV: 83.6 fL (ref 80.0–100.0)
Platelets: 283 10*3/uL (ref 150–400)
RBC: 3.6 MIL/uL — ABNORMAL LOW (ref 3.87–5.11)
RDW: 13.9 % (ref 11.5–15.5)
WBC: 15.7 10*3/uL — ABNORMAL HIGH (ref 4.0–10.5)
nRBC: 0 % (ref 0.0–0.2)

## 2021-12-29 LAB — TYPE AND SCREEN
ABO/RH(D): B POS
Antibody Screen: NEGATIVE

## 2021-12-29 LAB — RPR: RPR Ser Ql: NONREACTIVE

## 2021-12-29 MED ORDER — WITCH HAZEL-GLYCERIN EX PADS: 1.0000 | MEDICATED_PAD | CUTANEOUS | Status: DC | PRN

## 2021-12-29 MED ORDER — OXYCODONE-ACETAMINOPHEN 5-325 MG PO TABS
1.0000 | ORAL_TABLET | ORAL | Status: DC | PRN
  Administered 2021-12-29: 1 via ORAL
  Filled 2021-12-29: qty 1

## 2021-12-29 MED ORDER — LIDOCAINE HCL (PF) 1 % IJ SOLN: 30.0000 mL | INTRAMUSCULAR | Status: DC | PRN

## 2021-12-29 MED ORDER — ZOLPIDEM TARTRATE 5 MG PO TABS: 5.0000 mg | ORAL_TABLET | Freq: Every evening | ORAL | Status: DC | PRN

## 2021-12-29 MED ORDER — COCONUT OIL OIL: 1.0000 | TOPICAL_OIL | Status: DC | PRN

## 2021-12-29 MED ORDER — ONDANSETRON HCL 4 MG PO TABS: 4.0000 mg | ORAL_TABLET | ORAL | Status: DC | PRN

## 2021-12-29 MED ORDER — TETANUS-DIPHTH-ACELL PERTUSSIS 5-2.5-18.5 LF-MCG/0.5 IM SUSY: 0.5000 mL | PREFILLED_SYRINGE | Freq: Once | INTRAMUSCULAR | Status: DC

## 2021-12-29 MED ORDER — OXYTOCIN 10 UNIT/ML IJ SOLN
INTRAMUSCULAR | Status: AC
  Administered 2021-12-29: 10 [IU]
  Filled 2021-12-29: qty 1

## 2021-12-29 MED ORDER — PRENATAL MULTIVITAMIN CH
1.0000 | ORAL_TABLET | Freq: Every day | ORAL | Status: DC
  Administered 2021-12-29: 1 via ORAL
  Filled 2021-12-29: qty 1

## 2021-12-29 MED ORDER — OXYCODONE-ACETAMINOPHEN 5-325 MG PO TABS
2.0000 | ORAL_TABLET | ORAL | Status: DC | PRN
  Administered 2021-12-29 – 2021-12-30 (×2): 2 via ORAL
  Filled 2021-12-29 (×2): qty 2

## 2021-12-29 MED ORDER — DIPHENHYDRAMINE HCL 25 MG PO CAPS: 25.0000 mg | ORAL_CAPSULE | Freq: Four times a day (QID) | ORAL | Status: DC | PRN

## 2021-12-29 MED ORDER — BENZOCAINE-MENTHOL 20-0.5 % EX AERO
1.0000 | INHALATION_SPRAY | CUTANEOUS | Status: DC | PRN
  Filled 2021-12-29: qty 56

## 2021-12-29 MED ORDER — SOD CITRATE-CITRIC ACID 500-334 MG/5ML PO SOLN: 30.0000 mL | ORAL | Status: DC | PRN

## 2021-12-29 MED ORDER — IBUPROFEN 600 MG PO TABS
600.0000 mg | ORAL_TABLET | Freq: Once | ORAL | Status: AC
Start: 2021-12-29 — End: 2021-12-29
  Administered 2021-12-29: 600 mg via ORAL
  Filled 2021-12-29: qty 1

## 2021-12-29 MED ORDER — IBUPROFEN 600 MG PO TABS
600.0000 mg | ORAL_TABLET | Freq: Four times a day (QID) | ORAL | Status: DC
  Administered 2021-12-29 – 2021-12-30 (×5): 600 mg via ORAL
  Filled 2021-12-29 (×5): qty 1

## 2021-12-29 MED ORDER — ONDANSETRON HCL 4 MG/2ML IJ SOLN: 4.0000 mg | INTRAMUSCULAR | Status: DC | PRN

## 2021-12-29 MED ORDER — ONDANSETRON HCL 4 MG/2ML IJ SOLN: 4.0000 mg | Freq: Four times a day (QID) | INTRAMUSCULAR | Status: DC | PRN

## 2021-12-29 MED ORDER — SENNOSIDES-DOCUSATE SODIUM 8.6-50 MG PO TABS
2.0000 | ORAL_TABLET | ORAL | Status: DC
  Administered 2021-12-29: 2 via ORAL
  Filled 2021-12-29: qty 2

## 2021-12-29 MED ORDER — ACETAMINOPHEN 325 MG PO TABS
650.0000 mg | ORAL_TABLET | ORAL | Status: DC | PRN
  Administered 2021-12-29 (×2): 650 mg via ORAL

## 2021-12-29 MED ORDER — ACETAMINOPHEN 325 MG PO TABS
650.0000 mg | ORAL_TABLET | ORAL | Status: DC | PRN
  Administered 2021-12-29: 650 mg via ORAL
  Filled 2021-12-29 (×3): qty 2

## 2021-12-29 MED ORDER — SIMETHICONE 80 MG PO CHEW: 80.0000 mg | CHEWABLE_TABLET | ORAL | Status: DC | PRN

## 2021-12-29 MED ORDER — DIBUCAINE (PERIANAL) 1 % EX OINT: 1.0000 | TOPICAL_OINTMENT | CUTANEOUS | Status: DC | PRN

## 2021-12-29 MED ORDER — FLEET ENEMA 7-19 GM/118ML RE ENEM: 1.0000 | ENEMA | RECTAL | Status: DC | PRN

## 2021-12-29 NOTE — Lactation Note (Signed)
This note was copied from a baby's chart. Lactation Consultation Note  Patient Name: Girl Malan Werk SMOLM'B Date: 12/29/2021 Reason for consult: Initial assessment;Term;Other (Comment) (per mom baby recently fed 30 mins. P4, exp BF. LC reviewed BF goals for 24 hours - feed with feeding cues and by 3 hours to placed STS. MOm aware to call with feeding cues.) Age:29 hours  Maternal Data Does the patient have breastfeeding experience prior to this delivery?: Yes How long did the patient breastfeed?: per mom BF 1st baby 3 months, 2nd 2 weeks, and 3rd baby 6 months - milk came in but also did Breast / formula. desire to exclusively BF this time  Feeding Mother's Current Feeding Choice: Breast Milk  LATCH Score                    Lactation Tools Discussed/Used    Interventions Interventions: Breast feeding basics reviewed;Education;LC Services brochure  Discharge Pump: DEBP;Personal (per mom DEBP from the insurance company)  Consult Status Consult Status: Follow-up Date: 12/29/21 Follow-up type: In-patient    Matilde Sprang Sitlali Koerner 12/29/2021, 9:38 AM

## 2021-12-29 NOTE — Discharge Summary (Signed)
Postpartum Discharge Summary  Date of Service updated***     Patient Name: Elizabeth [name] DOB: [birth year] MRN: [id number]  Date of admission: 12/29/2021 Delivery date:12/29/2021  Delivering provider: Seabron Spates  Date of discharge: 12/29/2021  Admitting diagnosis: Supervision of low-risk pregnancy, third trimester [Z34.93] Intrauterine pregnancy: [redacted]w[redacted]d     Secondary diagnosis:  Principal Problem:   Supervision of low-risk pregnancy, third trimester Active Problems:   Precipitous delivery  Additional problems: None    Discharge diagnosis: Term Pregnancy Delivered                                              Post partum procedures: None Augmentation:  None Complications: None  Hospital course: Onset of Labor With Vaginal Delivery      31 y.o. yo F2T2446 at [redacted]w[redacted]d was admitted in  on 12/29/2021. Patient had an uncomplicated labor course as follows: Patient arrived on MAU unit with infant in her arms and placenta still attached. Placenta was delivered in MAU by CNM and patient was transferred to [facility] in stable condition.  Membrane Rupture Time/Date:  ,12/29/2021   Delivery Method:Vaginal, Spontaneous  Episiotomy: None  Lacerations:  None  Patient had an uncomplicated postpartum course.  She is ambulating, tolerating a regular diet, passing flatus, and urinating well. Patient is discharged home in stable condition on 12/29/21.  Newborn Data: Birth date:12/29/2021  Birth time:1:25 AM  Gender:Female  Living status:Living  Apgars: ,  Weight:3030 g   Magnesium Sulfate received: No BMZ received: No Rhophylac:No MMR:{MMR:30440033} T-DaP:{Tdap:23962} Flu: {KMM:38177} Transfusion:{Transfusion received:30440034}  Physical exam  Vitals:   12/29/21 0320 12/29/21 0430 12/29/21 0452 12/29/21 0600  BP: 133/83 127/63 135/81 113/71  Pulse: 83 85 83 78  Resp:   18 18  Temp:   98.4 F (36.9 C) 98 F (36.7 C)  TempSrc:   Oral Oral  SpO2:       General: {Exam;  general:21111117} Lochia: {Desc; appropriate/inappropriate:30686::"appropriate"} Uterine Fundus: {Desc; firm/soft:30687} Incision: {Exam; incision:21111123} DVT Evaluation: {Exam; NHA:5790383} Labs: Lab Results  Component Value Date   WBC 15.7 (H) 12/29/2021   HGB 10.1 (L) 12/29/2021   HCT 30.1 (L) 12/29/2021   MCV 83.6 12/29/2021   PLT 283 12/29/2021      Latest Ref Rng & Units 11/08/2015    2:22 PM  CMP  Glucose 65 - 99 mg/dL 103   BUN 6 - 20 mg/dL 7   Creatinine 0.44 - 1.00 mg/dL 0.62   Sodium 135 - 145 mmol/L 136   Potassium 3.5 - 5.1 mmol/L 4.2   Chloride 101 - 111 mmol/L 106   CO2 22 - 32 mmol/L 21   Calcium 8.9 - 10.3 mg/dL 8.9   Total Protein 6.5 - 8.1 g/dL 6.2   Total Bilirubin 0.3 - 1.2 mg/dL 0.9   Alkaline Phos 38 - 126 U/L 135   AST 15 - 41 U/L 14   ALT 14 - 54 U/L 8    Edinburgh Score:    04/27/2018    1:41 PM  Edinburgh Postnatal Depression Scale Screening Tool  I have been able to laugh and see the funny side of things. 0  I have looked forward with enjoyment to things. 0  I have blamed myself unnecessarily when things went wrong. 0  I have been anxious or worried for no good reason. 0  I have felt  scared or panicky for no good reason. 0  Things have been getting on top of me. 0  I have been so unhappy that I have had difficulty sleeping. 0  I have felt sad or miserable. 0  I have been so unhappy that I have been crying. 0     After visit meds:  Allergies as of 12/29/2021   No Known Allergies   Med Rec must be completed prior to using this [facility]***        Discharge home in stable condition Infant Feeding: {Baby feeding:23562} Infant Disposition:{CHL IP OB HOME WITH PVVZSM:27078} Discharge instruction: per After Visit Summary and Postpartum booklet. Activity: Advance as tolerated. Pelvic rest for 6 weeks.  Diet: {OB MLJQ:49201007} Future Appointments: Future Appointments  Date Time Provider [place]  12/31/2021 11:45 AM  Megan Salon, MD [facility]  01/01/2022  7:45 AM [facility] SCHED ROOM [facility] None   Follow up Visit: Message sent by KK on 12/29/2021  Please schedule this patient for a In person postpartum visit in 4 weeks with the following provider: Any provider. Additional Postpartum F/U: None   Low risk pregnancy complicated by:  nothing Delivery mode:  Vaginal, Spontaneous  Anticipated Birth Control:  Plans Interval BTL   12/29/2021 Starr [place], CNM

## 2021-12-29 NOTE — MAU Note (Signed)
Pt arrived in car - with baby del in pt's arms . Brought to Rm 20 via W/C - with cord attached . Mom says baby del  in her car without complications at 0125 at pt's house in driveway.

## 2021-12-29 NOTE — Lactation Note (Signed)
This note was copied from a baby's chart. Lactation Consultation Note  Patient Name: Elizabeth [name] [id number] Date: 12/29/2021 Reason for consult: Initial assessment;Term;Other (Comment) (per mom baby recently fed 30 mins. P4, exp BF. LC reviewed BF goals for 24 hours - feed with feeding cues and by 3 hours to placed STS. MOm aware to call with feeding cues.) Age:29 hours Baby has been to the breast x 2 - 30 -60 mins./ HNV , HNS   Maternal Data Does the patient have breastfeeding experience prior to this delivery?: Yes How long did the patient breastfeed?: per mom BF 1st baby 3 months, 2nd 2 weeks, and 3rd baby 6 months - milk came in but also did Breast / formula. desire to exclusively BF this time  Feeding Mother's Current Feeding Choice: Breast Milk  LATCH Score                    Lactation Tools Discussed/Used    Interventions    Discharge Pump: DEBP;Personal (per mom DEBP from the insurance company)  Consult Status Consult Status: Follow-up Date: 12/29/21 Follow-up type: In-patient    [name] 12/29/2021, 9:36 AM

## 2021-12-29 NOTE — Lactation Note (Signed)
This note was copied from a baby's chart. Lactation Consultation Note  Patient Name: Elizabeth [name] [id number] Date: 12/29/2021 Reason for consult: Follow-up assessment Age:29 hours P4, term female infant. Mom's plan is to exclusively breastfeed and not supplement infant with formula, per mom, she supplemented with  formula with her previous children . Per mom, she feels infant is starting to cluster feed, mom is breastfeeding infant according to cues, on demand, 8 to 12+ times within 24 hours, skin to skin. Mom used her  own personal pump  from home, earlier today and gave infant colostrum she had expressed. Mom had infant latched on her left breast when LC entered the room, infant latched with depth BF for 15 minutes, afterwards mom hand express and infant was given 8 mls by spoon. Mom was tired and Dad burped infant afterwards so mom could rest.  Mom will continue to breastfeed infant according to hunger cues, on demand, 8 to 12+ time within 24 hours, skin to skin. LC reviewed mom's maternal rest, diet and hydration.  Maternal Data    Feeding Mother's Current Feeding Choice: Breast Milk  LATCH Score Latch: Grasps breast easily, tongue down, lips flanged, rhythmical sucking.  Audible Swallowing: A few with stimulation  Type of Nipple: Everted at rest and after stimulation  Comfort (Breast/Nipple): Soft / non-tender  Hold (Positioning): No assistance needed to correctly position infant at breast.  LATCH Score: 9   Lactation Tools Discussed/Used    Interventions Interventions: Skin to skin;Hand express;Expressed milk;Education  Discharge    Consult Status Consult Status: Follow-up Date: 12/30/21 Follow-up type: In-patient    Danelle Earthly 12/29/2021, 10:03 PM

## 2021-12-29 NOTE — H&P (Cosign Needed Addendum)
Elizabeth [name] is a 31 y.o. female presenting for SOL and delivery in route to the hospital. Hx of low-risk pregnancy with complication of anemia. OB History     Gravida  6   Para  3   Term  3   Preterm  0   AB  2   Living  3      SAB  0   IAB  1   Ectopic  1   Multiple  0   Live Births  3          Past Medical History:  Diagnosis Date   History of ectopic pregnancy    treated with methotrexate   Past Surgical History:  Procedure Laterality Date   THERAPEUTIC ABORTION     Family History: family history includes Hearing loss in her mother; Thyroid disease in her mother. Social History:  reports that she quit smoking about 7 years ago. Her smoking use included cigarettes. She has never used smokeless tobacco. She reports current alcohol use. She reports that she does not use drugs.     Maternal Diabetes: No Genetic Screening: Normal Maternal Ultrasounds/Referrals: Normal Fetal Ultrasounds or other Referrals:  None Maternal Substance Abuse:  No Significant Maternal Medications:  None Significant Maternal Lab Results:  Group B Strep negative Other Comments:  None  Review of Systems Maternal Medical History:  Prenatal complications: No bleeding, cholelithiasis, HIV, oligohydramnios, polyhydramnios, pre-eclampsia or preterm labor.       Last menstrual period 03/27/2021. Exam Physical Exam Constitutional:      Appearance: Normal appearance.  Pulmonary:     Effort: Pulmonary effort is normal.  Abdominal:     General: Abdomen is flat.     Tenderness: There is no guarding.  Skin:    General: Skin is warm and dry.  Neurological:     Mental Status: She is alert.     Prenatal labs: ABO, Rh: B/Positive/-- (01/03 1003) Antibody: Negative (01/03 1003) Rubella: <0.90 (01/03 1003) RPR: Non Reactive (05/02 0814)  HBsAg: Negative (01/03 1003)  HIV: Non Reactive (05/02 0814)  GBS: Negative/-- (06/27 1120)   Assessment/Plan:  Management of 3rd  stage of Labor: - cord cut by FOB, placenta delivered  - EBL <10   Anemia:  - recheck CBC in am, Hgb 10/09/21 10.3   Glendale Chard, DO   Attestation:  I confirm that I have verified the information documented in the resident's note and that I have also personally reperformed the physical exam and all medical decision making activities.   The patient was seen and examined by me also Agree with note   Aviva Signs, CNM

## 2021-12-30 MED ORDER — ACETAMINOPHEN 500 MG PO TABS: 1000.0000 mg | ORAL_TABLET | Freq: Four times a day (QID) | ORAL | 0 refills | Status: DC | PRN

## 2021-12-30 MED ORDER — IBUPROFEN 600 MG PO TABS: 600.0000 mg | ORAL_TABLET | Freq: Four times a day (QID) | ORAL | 2 refills | Status: DC | PRN

## 2021-12-30 NOTE — Progress Notes (Signed)
Prior to discharge, RN educated MOB on getting the Tdap vaccine and the MMR vaccine.  Patient has decided to decline both vaccines and will follow up with her MD about getting them in the office.  Augustus Zurawski, [place]

## 2021-12-31 ENCOUNTER — Encounter (HOSPITAL_BASED_OUTPATIENT_CLINIC_OR_DEPARTMENT_OTHER): Payer: Medicaid Other | Admitting: Obstetrics & Gynecology

## 2021-12-31 ENCOUNTER — Other Ambulatory Visit (HOSPITAL_BASED_OUTPATIENT_CLINIC_OR_DEPARTMENT_OTHER): Payer: Self-pay

## 2021-12-31 MED ORDER — IBUPROFEN 600 MG PO TABS: 600.0000 mg | ORAL_TABLET | Freq: Four times a day (QID) | ORAL | 2 refills | Status: DC | PRN

## 2022-01-01 ENCOUNTER — Inpatient Hospital Stay (HOSPITAL_COMMUNITY): Payer: Medicaid Other

## 2022-01-01 ENCOUNTER — Inpatient Hospital Stay (HOSPITAL_COMMUNITY): Admission: AD | Admit: 2022-01-01 | Payer: Medicaid Other | Source: Home / Self Care

## 2022-02-08 ENCOUNTER — Ambulatory Visit (INDEPENDENT_AMBULATORY_CARE_PROVIDER_SITE_OTHER): Payer: Medicaid Other | Admitting: Medical

## 2022-02-08 ENCOUNTER — Encounter (HOSPITAL_BASED_OUTPATIENT_CLINIC_OR_DEPARTMENT_OTHER): Payer: Self-pay | Admitting: Medical

## 2022-02-08 DIAGNOSIS — Z3042 Encounter for surveillance of injectable contraceptive: Secondary | ICD-10-CM

## 2022-02-08 DIAGNOSIS — Z3009 Encounter for other general counseling and advice on contraception: Secondary | ICD-10-CM

## 2022-02-08 DIAGNOSIS — Z3202 Encounter for pregnancy test, result negative: Secondary | ICD-10-CM

## 2022-02-08 LAB — POCT URINE PREGNANCY: Preg Test, Ur: NEGATIVE

## 2022-02-08 MED ORDER — MEDROXYPROGESTERONE ACETATE 150 MG/ML IM SUSP
150.0000 mg | Freq: Once | INTRAMUSCULAR | Status: AC
  Administered 2022-02-08: 150 mg via INTRAMUSCULAR

## 2022-02-08 NOTE — Progress Notes (Signed)
Post Partum Visit Note  Elizabeth [name] is a 31 y.o. [id number] female who presents for a postpartum visit. She is 6 weeks postpartum following a normal spontaneous vaginal delivery.  I have fully reviewed the prenatal and intrapartum course. The delivery was at 39.4 gestational weeks.  Anesthesia: none. Postpartum course has been uncomplicated. Baby is doing well. Baby is feeding by breast. Bleeding no bleeding. Bowel function is normal. Bladder function is normal. Patient is sexually active. Contraception method is condoms. Postpartum depression screening: negative.   Upstream - 02/08/22 1111       Pregnancy Intention Screening   Does the patient want to become pregnant in the next year? No    Does the patient's partner want to become pregnant in the next year? No    Would the patient like to discuss contraceptive options today? Yes      Contraception Wrap Up   Current Method Female Condom    End Method Hormonal Injection    Contraception Counseling Provided Yes            The pregnancy intention screening data noted above was reviewed. Potential methods of contraception were discussed. The patient elected to proceed with Hormonal Injection.   Edinburgh Postnatal Depression Scale - 02/08/22 1110       Edinburgh Postnatal Depression Scale:  In the Past 7 Days   I have been able to laugh and see the funny side of things. 0    I have looked forward with enjoyment to things. 0    I have blamed myself unnecessarily when things went wrong. 0    I have been anxious or worried for no good reason. 0    I have felt scared or panicky for no good reason. 0    Things have been getting on top of me. 0    I have been so unhappy that I have had difficulty sleeping. 0    I have felt sad or miserable. 0    I have been so unhappy that I have been crying. 0    The thought of harming myself has occurred to me. 0    Edinburgh Postnatal Depression Scale Total 0             Health  Maintenance Due  Topic Date Due   COVID-19 Vaccine (1) Never done   INFLUENZA VACCINE  01/08/2022   PAP-Cervical Cytology Screening  02/08/2022   PAP SMEAR-Modifier  02/08/2022    The following portions of the patient's history were reviewed and updated as appropriate: allergies, current medications, past family history, past medical history, past social history, past surgical history, and problem list.  Review of Systems Pertinent items are noted in HPI.  Objective:  BP 127/87   Pulse 100   Ht 5\' 4"  (1.626 m)   Wt 179 lb (81.2 kg)   LMP 03/27/2021   Breastfeeding Yes   BMI 30.73 kg/m    General:  alert and cooperative   Breasts:  not indicated  Lungs: clear to auscultation bilaterally  Heart:  regular rate and rhythm, S1, S2 normal, no murmur, click, rub or gallop  Abdomen: soft, non-tender; bowel sounds normal; no masses,  no organomegaly   Wound N/A  GU exam:  not indicated       Assessment:    1. Routine postpartum follow-up  2. Unwanted fertility - Depo provera today   Normal postpartum exam.   Plan:   Essential components of care per ACOG recommendations:  1.  Mood and well being: Patient with negative depression screening today. Reviewed local resources for support.  - Patient tobacco use? No.   - hx of drug use? No.    2. Infant care and feeding:  -Patient currently breastmilk feeding? Yes. Reviewed importance of draining breast regularly to support lactation.  -Social determinants of health (SDOH) reviewed in EPIC. No concerns  3. Sexuality, contraception and birth spacing - Patient does not want a pregnancy in the next year.  Desired family size is 4 children.  - Reviewed reproductive life planning. Reviewed contraceptive methods based on pt preferences and effectiveness.  Patient desired Hormonal Injection today.   - Discussed birth spacing of 18 months  4. Sleep and fatigue -Encouraged family/partner/community support of 4 hrs of uninterrupted  sleep to help with mood and fatigue  5. Physical Recovery  - Discussed patients delivery and complications. She describes her labor as good. - Patient had a Vaginal, no problems at delivery. Patient had  no  laceration. Perineal healing reviewed. Patient expressed understanding - Patient has urinary incontinence? No. - Patient is safe to resume physical and sexual activity  6.  Health Maintenance - HM due items addressed Yes - Last pap smear  Diagnosis  Date Value Ref Range Status  02/09/2019   Final   NEGATIVE FOR INTRAEPITHELIAL LESIONS OR MALIGNANCY.   Pap smear not done at today's visit. Patient requests to reschedule for later date -Breast Cancer screening indicated? No.   7. Chronic Disease/Pregnancy Condition follow up: None - PCP follow up PRN  Vonzella Nipple, PA-C [facility] [employer]

## 2022-02-08 NOTE — Addendum Note (Signed)
Addended by: Harrie Jeans on: 02/08/2022 11:41 AM   Modules accepted: Orders

## 2022-04-08 ENCOUNTER — Ambulatory Visit (INDEPENDENT_AMBULATORY_CARE_PROVIDER_SITE_OTHER): Payer: Medicaid Other | Admitting: Nurse Practitioner

## 2022-04-08 ENCOUNTER — Encounter (HOSPITAL_BASED_OUTPATIENT_CLINIC_OR_DEPARTMENT_OTHER): Payer: Self-pay | Admitting: Nurse Practitioner

## 2022-04-08 VITALS — BP 147/92 | HR 99 | Ht 64.0 in | Wt 178.7 lb

## 2022-04-08 DIAGNOSIS — N3001 Acute cystitis with hematuria: Secondary | ICD-10-CM

## 2022-04-08 LAB — POCT URINALYSIS DIP (CLINITEK)
Bilirubin, UA: NEGATIVE
Glucose, UA: NEGATIVE mg/dL
Ketones, POC UA: NEGATIVE mg/dL
Nitrite, UA: NEGATIVE
POC PROTEIN,UA: NEGATIVE
Spec Grav, UA: 1.025 (ref 1.010–1.025)
Urobilinogen, UA: 0.2 E.U./dL
pH, UA: 6 (ref 5.0–8.0)

## 2022-04-08 MED ORDER — NITROFURANTOIN MONOHYD MACRO 100 MG PO CAPS: 100.0000 mg | ORAL_CAPSULE | Freq: Two times a day (BID) | ORAL | 0 refills | Status: AC

## 2022-04-08 MED ORDER — FLUCONAZOLE 150 MG PO TABS: ORAL_TABLET | ORAL | 2 refills | Status: AC

## 2022-04-08 NOTE — Patient Instructions (Signed)
I have sent in a medication for you to take for the urinary tract infection. This medication is safe for you to take while breastfeeding.

## 2022-04-08 NOTE — Progress Notes (Signed)
Elizabeth Render, DNP, AGNP-c [facility] [address]  [place], [place] [address] [phone]  Subjective:   Elizabeth [name] is a 31 y.o. female presents to day for evaluation of: Acute Visit (Pt presents today for ? UTI.)  1. Acute cystitis without hematuria [name] endorses urinary symptoms that started on Thursday.  She tells me that her bladder feels very full and has the urge to urinate frequency however when she goes to the bathroom there is only a small amount of urination present.  She does endorse sensation of spasming with urination. She denies dysuria, low back pain, fevers, chills.  She has not had any new sexual partners.  She tells me that during pregnancy she did have frequent urinary tract infections and her symptoms at this time are very similar to previous infections.   She has been spotting recently, but she is on depo provera.   PMH, Medications, and Allergies reviewed and updated in chart as appropriate.   ROS negative except for what is listed in HPI. Objective:  BP (!) 147/92   Pulse 99   Ht 5\' 4"  (1.626 m)   Wt 178 lb 11.2 oz (81.1 kg)   SpO2 100%   BMI 30.67 kg/m  Physical Exam Vitals and nursing note reviewed.  Constitutional:      General: She is not in acute distress.    Appearance: Normal appearance. She is not ill-appearing.  HENT:     Head: Normocephalic.  Eyes:     Extraocular Movements: Extraocular movements intact.     Pupils: Pupils are equal, round, and reactive to light.  Cardiovascular:     Rate and Rhythm: Normal rate and regular rhythm.     Pulses: Normal pulses.     Heart sounds: Normal heart sounds.  Pulmonary:     Effort: Pulmonary effort is normal.     Breath sounds: Normal breath sounds.  Abdominal:     General: Abdomen is flat. There is no distension.     Palpations: Abdomen is soft.     Tenderness: There is abdominal tenderness. There is no right CVA tenderness, left CVA  tenderness or guarding.     Comments: Suprapubic tenderness present  Musculoskeletal:     Cervical back: Normal range of motion.     Right lower leg: No edema.     Left lower leg: No edema.  Skin:    General: Skin is warm and dry.  Neurological:     General: No focal deficit present.     Mental Status: She is alert and oriented to person, place, and time.     Motor: No weakness.  Psychiatric:        Mood and Affect: Mood normal.        Behavior: Behavior normal.        Thought Content: Thought content normal.        Judgment: Judgment normal.           Assessment & Plan:   Problem List Items Addressed This Visit     Acute cystitis with hematuria - Primary    Positive UA in the office today with no alarm symptoms present at this time.  We will begin treatment today with nitrofurantoin.  Increased hydration encouraged.  We will also send Diflucan for patient to utilize in the event that she develops symptoms of vaginal yeast infection with antibiotic use.  Encouraged follow-up if symptoms worsen or fail to improve with  treatment.      Relevant Medications   nitrofurantoin, macrocrystal-monohydrate, (MACROBID) 100 MG capsule   fluconazole (DIFLUCAN) 150 MG tablet   Other Relevant Orders   POCT URINALYSIS DIP (CLINITEK) (Completed)      Elizabeth Render, DNP, AGNP-c 04/08/2022  5:53 PM    History, Medications, Surgery, SDOH, and Family History reviewed and updated as appropriate.

## 2022-04-08 NOTE — Assessment & Plan Note (Signed)
Positive UA in the office today with no alarm symptoms present at this time.  We will begin treatment today with nitrofurantoin.  Increased hydration encouraged.  We will also send Diflucan for patient to utilize in the event that she develops symptoms of vaginal yeast infection with antibiotic use.  Encouraged follow-up if symptoms worsen or fail to improve with treatment.

## 2022-04-26 ENCOUNTER — Ambulatory Visit (HOSPITAL_BASED_OUTPATIENT_CLINIC_OR_DEPARTMENT_OTHER): Payer: Medicaid Other

## 2022-04-26 ENCOUNTER — Telehealth (HOSPITAL_BASED_OUTPATIENT_CLINIC_OR_DEPARTMENT_OTHER): Payer: Self-pay | Admitting: Obstetrics & Gynecology

## 2022-04-26 NOTE — Telephone Encounter (Signed)
Called pt and left a message to please call the office back to let us know if she is still coming in today.Reminded her that the office closes at 12 noon.

## 2022-05-21 ENCOUNTER — Encounter (HOSPITAL_BASED_OUTPATIENT_CLINIC_OR_DEPARTMENT_OTHER): Payer: Self-pay | Admitting: Advanced Practice Midwife

## 2022-05-21 ENCOUNTER — Ambulatory Visit (INDEPENDENT_AMBULATORY_CARE_PROVIDER_SITE_OTHER): Payer: Medicaid Other | Admitting: Advanced Practice Midwife

## 2022-05-21 ENCOUNTER — Other Ambulatory Visit (HOSPITAL_COMMUNITY)
Admission: RE | Admit: 2022-05-21 | Discharge: 2022-05-21 | Disposition: A | Discharge status: Home / Self Care | Payer: Medicaid Other | Source: Ambulatory Visit | Attending: Advanced Practice Midwife | Admitting: Advanced Practice Midwife

## 2022-05-21 VITALS — BP 120/97 | HR 82 | Ht 64.0 in | Wt 180.0 lb

## 2022-05-21 DIAGNOSIS — Z01419 Encounter for gynecological examination (general) (routine) without abnormal findings: Secondary | ICD-10-CM | POA: Insufficient documentation

## 2022-05-21 DIAGNOSIS — Z124 Encounter for screening for malignant neoplasm of cervix: Secondary | ICD-10-CM | POA: Diagnosis not present

## 2022-05-21 DIAGNOSIS — Z3009 Encounter for other general counseling and advice on contraception: Secondary | ICD-10-CM

## 2022-05-21 NOTE — Progress Notes (Signed)
   Subjective:     Elizabeth [name] is a 31 y.o. female here at [facility] for a routine exam.  Current complaints: none, had mood changes on Depo so does not want to continue, no periods currently with exclusive breastfeeding, considering nonhormonal options.  Personal health questionnaire reviewed: yes.  Do you have a primary care provider? yes Do you feel safe at home? yes  Flowsheet Row Office Visit from 05/21/2022 in [facility]  PHQ-2 Total Score 0       Health Maintenance Due  Topic Date Due   COVID-19 Vaccine (1) Never done   INFLUENZA VACCINE  01/08/2022   PAP-Cervical Cytology Screening  02/08/2022   PAP SMEAR-Modifier  02/08/2022     Risk factors for chronic health problems: Smoking: Alchohol/how much: Pt BMI: Body mass index is 30.9 kg/m.   Gynecologic History No LMP recorded. Contraception: condoms and lactational amenorrhea Last Pap: 2020. Results were: normal Last mammogram: n/a.   Obstetric History OB History  Gravida Para Term Preterm AB Living  6 3 3  0 2 3  SAB IAB Ectopic Multiple Live Births  0 1 1 0 3    # Outcome Date GA Lbr Len/2nd Weight Sex Delivery Anes PTL Lv  6 Gravida           5 Term 03/28/18 [redacted]w[redacted]d 04:50 / 00:05 6 lb 1.5 oz (2.765 kg) M Vag-Spont None  LIV  4 Term 11/21/15 [redacted]w[redacted]d 01:49 / 00:07 6 lb 15.6 oz (3.165 kg) M Vag-Spont None  LIV  3 Ectopic 2016          2 Term 04/21/12 [redacted]w[redacted]d 13:09 / 00:16 6 lb (2.721 kg) F Vag-Spont EPI  LIV  1 IAB              The following portions of the patient's history were reviewed and updated as appropriate: allergies, current medications, past family history, past medical history, past social history, past surgical history, and problem list.  Review of Systems Pertinent items noted in HPI and remainder of comprehensive ROS otherwise negative.    Objective:  BP (!) 120/97 (BP Location: Right Arm, Patient Position: Sitting, Cuff Size: Large)   Pulse 82   Ht 5\' 4"  (1.626  m) Comment: Reported  Wt 180 lb (81.6 kg)   BMI 30.90 kg/m    VS reviewed, nursing note reviewed,  Constitutional: well developed, well nourished, no distress HEENT: normocephalic, thyroid without enlargement or mass HEART: RRR, no murmurs rubs/gallops RESP: clear and equal to auscultation bilaterally in all lobes  Breast Exam: Deferred with low risks and shared decision making, discussed recommendation to start mammogram between 40-50 yo/  Abdomen: soft Neuro: alert and oriented x 3 Skin: warm, dry Psych: affect normal Pelvic exam:  Performed: Cervix pink, visually closed, without lesion, scant white creamy discharge, vaginal walls and external genitalia normal Bimanual exam: Cervix 0/long/high, firm, anterior, neg CMT, uterus nontender, nonenlarged, adnexa without tenderness, enlargement, or mass        Assessment/Plan:   1. Well woman exam with routine gynecological exam  - Cytology - PAP( [facility])   2. Screening for cervical cancer    3. Encounter for counseling regarding contraception --Discussed pt contraceptive plans and reviewed contraceptive methods based on pt preferences and effectiveness.  Pt prefers to discuss with her  husband, considering Paragard.    Return in about 1 year (around 05/22/2023) for annual exam.   , CNM 4:51 PM

## 2022-05-23 LAB — CYTOLOGY - PAP: Diagnosis: NEGATIVE

## 2022-06-18 DIAGNOSIS — H5213 Myopia, bilateral: Secondary | ICD-10-CM | POA: Diagnosis not present

## 2022-09-12 ENCOUNTER — Encounter (HOSPITAL_COMMUNITY): Payer: Self-pay | Admitting: *Deleted

## 2022-09-12 ENCOUNTER — Ambulatory Visit (HOSPITAL_COMMUNITY)
Admission: EM | Admit: 2022-09-12 | Discharge: 2022-09-12 | Disposition: A | Discharge status: Home / Self Care | Payer: Medicaid Other | Attending: Sports Medicine | Admitting: Sports Medicine

## 2022-09-12 DIAGNOSIS — R197 Diarrhea, unspecified: Secondary | ICD-10-CM | POA: Diagnosis not present

## 2022-09-12 DIAGNOSIS — R112 Nausea with vomiting, unspecified: Secondary | ICD-10-CM | POA: Diagnosis not present

## 2022-09-12 LAB — POC URINE PREG, ED: Preg Test, Ur: NEGATIVE

## 2022-09-12 MED ORDER — ONDANSETRON HCL 4 MG/2ML IJ SOLN
4.0000 mg | Freq: Once | INTRAMUSCULAR | Status: AC
  Administered 2022-09-12: 4 mg via INTRAMUSCULAR

## 2022-09-12 MED ORDER — ONDANSETRON 4 MG PO TBDP
ORAL_TABLET | ORAL | Status: AC
  Filled 2022-09-12: qty 1

## 2022-09-12 MED ORDER — ONDANSETRON HCL 4 MG/2ML IJ SOLN
INTRAMUSCULAR | Status: AC
  Filled 2022-09-12: qty 2

## 2022-09-12 MED ORDER — ONDANSETRON 4 MG PO TBDP: 4.0000 mg | ORAL_TABLET | Freq: Three times a day (TID) | ORAL | 0 refills | Status: AC | PRN

## 2022-09-12 MED ORDER — ONDANSETRON 4 MG PO TBDP
4.0000 mg | ORAL_TABLET | Freq: Once | ORAL | Status: AC
  Administered 2022-09-12: 4 mg via ORAL

## 2022-09-12 NOTE — ED Provider Notes (Signed)
[name]    CSN: [id number] Arrival date & time: 09/12/22  1029      History   Chief Complaint Chief Complaint  Patient presents with   Abdominal Pain   Emesis   Nausea    HPI Elizabeth [name] is a 32 y.o. female.   Elizabeth [name] is here today with chief complaint of nausea, vomiting and abdominal pain that started earlier this morning.  Elizabeth [name] has not taken any medications.  Elizabeth [name] reports Elizabeth [name] has had multiple loose bowel movements this morning and has been extremely nauseated.  Her abdominal pain is minimal as compared to her nausea.  Elizabeth [name] denies any sick contacts.  Elizabeth [name] has had some subjective chills throughout the morning.  Elizabeth [name] denies fevers, bloody stools, sick contacts however Elizabeth [name] does have multiple school-aged children.  Elizabeth [name] is currently breast-feeding her daughter.  Elizabeth [name] reports Elizabeth [name] had her first period since her delivery that lasted 1 day on Saturday.  Elizabeth [name] is currently having unprotected sexual intercourse.   Abdominal Pain Associated symptoms: vomiting   Emesis Associated symptoms: abdominal pain     Past Medical History:  Diagnosis Date   History of ectopic pregnancy    treated with methotrexate    Patient Active Problem List   Diagnosis Date Noted   Precipitous delivery 12/29/2021   Acute cystitis with hematuria 03/26/2021    Past Surgical History:  Procedure Laterality Date   THERAPEUTIC ABORTION      OB History     Gravida  6   Para  3   Term  3   Preterm  0   AB  2   Living  3      SAB  0   IAB  1   Ectopic  1   Multiple  0   Live Births  3            Home Medications    Prior to Admission medications   Medication Sig Start Date End Date Taking? Authorizing Provider  ferrous sulfate (FERROUSUL) 325 (65 FE) MG tablet Take 1 tablet (325 mg total) by mouth every other day. Patient not taking: Reported on 05/21/2022 10/10/21   Elvera Maria, CNM  fluconazole (DIFLUCAN) 150 MG tablet Take one tablet by mouth at the  first sign of symptoms of yeast. If no resolution, repeat dose in 72 hours. Patient not taking: Reported on 05/21/2022 04/08/22   Early, Coralee Pesa, NP  nitrofurantoin, macrocrystal-monohydrate, (MACROBID) 100 MG capsule Take 1 capsule (100 mg total) by mouth 2 (two) times daily. Patient not taking: Reported on 05/21/2022 04/08/22   Orma Render, NP  Prenatal Vit-Fe Fumarate-FA (PRENATAL VITAMINS) 28-0.8 MG TABS 1 tab by mouth daily with food. Patient not taking: Reported on 05/21/2022 05/01/21   Early, Coralee Pesa, NP    Family History Family History  Problem Relation Age of Onset   Hearing loss Mother    Thyroid disease Mother    Anesthesia problems Neg Hx     Social History Social History   Tobacco Use   Smoking status: Former    Types: Cigarettes    Quit date: 06/02/2014    Years since quitting: 8.2   Smokeless tobacco: Never  Vaping Use   Vaping Use: Never used  Substance Use Topics   Alcohol use: Yes    Comment: socially   Drug use: No     Allergies   Patient has no known allergies.   Review of Systems Review of Systems  Gastrointestinal:  Positive for abdominal pain and vomiting.  As listed above in HPI   Physical Exam Triage Vital Signs ED Triage Vitals [09/12/22 1055]  Enc Vitals Group     BP      Pulse      Resp      Temp      Temp src      SpO2      Weight      Height      Head Circumference      Peak Flow      Pain Score 6     Pain Loc      Pain Edu?      Excl. in [place]?    No data found.  Updated Vital Signs BP 135/81 (BP Location: Right Arm)   Pulse 76   Temp 97.9 F (36.6 C) (Oral)   Resp 20   LMP 09/07/2022 Comment: Only one day  SpO2 96%   Breastfeeding Yes   Physical Exam Vitals reviewed.  Constitutional:      General: Elizabeth [name] is not in acute distress.    Appearance: Elizabeth [name] is well-developed and normal weight. Elizabeth [name] is not ill-appearing, toxic-appearing or diaphoretic.     Comments: Patient appears uncomfortable rocking back and forth  throughout the duration of my exam secondary to her nausea  HENT:     Mouth/Throat:     Mouth: Mucous membranes are moist.  Cardiovascular:     Rate and Rhythm: Normal rate.  Pulmonary:     Effort: Pulmonary effort is normal.  Abdominal:     General: Abdomen is flat. Bowel sounds are normal. There is no distension.     Palpations: Abdomen is soft. There is no shifting dullness or mass.     Tenderness: There is generalized abdominal tenderness. There is no guarding or rebound.  Skin:    General: Skin is warm.  Neurological:     Mental Status: Elizabeth [name] is alert.  Psychiatric:        Mood and Affect: Mood is anxious.     UC Treatments / Results  Labs (all labs ordered are listed, but only abnormal results are displayed) Labs Reviewed  POC URINE PREG, ED    EKG   Radiology No results found.  Procedures Procedures (including critical care time)  Medications Ordered in UC Medications  ondansetron (ZOFRAN-ODT) disintegrating tablet 4 mg (has no administration in time range)    Initial Impression / Assessment and Plan / UC Course  I have reviewed the triage vital signs and the nursing notes.  Pertinent labs & imaging results that were available during my care of the patient were reviewed by me and considered in my medical decision making (see chart for details).     Nausea, vomiting, diarrhea UPT, negative today. Elizabeth [name] received 4 mg of Zofran today.  I discussed with her this may be expressed in the breastmilk, Elizabeth [name] verbalized understanding and stated Elizabeth [name] has some frozen breastmilk in the freezer Elizabeth [name] can use. Vitals are stable today.  Patient had very brief resolution of her symptoms after ODT Zofran however shortly after that had an episode of emesis after oral Zofran.  Elizabeth [name] was then injected with 4 mg intramuscularly.  Elizabeth [name] had some improvement with the IM Zofran.  I sent to her pharmacy a prescription for Zofran.  Counseled on the importance of hydration Elizabeth [name] is likely suffering  from a viral gastroenteritis.  Should her pain worsen, or Elizabeth [name] become unable to keep down liquids become dehydrated recommend Elizabeth [name]  return to the urgent care or the ER for fluid replacement.  Elizabeth [name] verbalized understanding.  I also emphasized hand hygiene. Final Clinical Impressions(s) / UC Diagnoses   Final diagnoses:  None   Discharge Instructions   None    ED Prescriptions   None    PDMP not reviewed this encounter.   Elmore Guise, DO 09/12/22 1238

## 2022-09-12 NOTE — Discharge Instructions (Signed)
Your urine pregnancy test was negative today.  I suspect you are suffering from a viral illness causing your nausea vomiting and diarrhea.  Is very important that you stay hydrated.  I have sent some Zofran to your pharmacy to help with your nausea.  Follow-up the urgent care with your primary care provider if symptoms worsen or fail to improve. Wash your hands and surfaces that you touch frequently.

## 2022-09-12 NOTE — ED Triage Notes (Signed)
Pt states she is having lower abdominal pain, nausea, vomiting since this morning. She hasn't taken any meds. She is breastfeeding and states she had her first cycle since her daughter was born on 8 months ago, the cycle only lasted one day. She is having unprotected sex.

## 2023-04-07 DIAGNOSIS — O3680X9 Pregnancy with inconclusive fetal viability, other fetus: Secondary | ICD-10-CM | POA: Diagnosis not present

## 2023-04-21 DIAGNOSIS — O3680X9 Pregnancy with inconclusive fetal viability, other fetus: Secondary | ICD-10-CM | POA: Diagnosis not present

## 2023-04-21 DIAGNOSIS — O0941 Supervision of pregnancy with grand multiparity, first trimester: Secondary | ICD-10-CM | POA: Diagnosis not present

## 2023-04-21 DIAGNOSIS — Z3480 Encounter for supervision of other normal pregnancy, unspecified trimester: Secondary | ICD-10-CM | POA: Diagnosis not present

## 2023-05-20 DIAGNOSIS — Z3482 Encounter for supervision of other normal pregnancy, second trimester: Secondary | ICD-10-CM | POA: Diagnosis not present

## 2023-05-20 DIAGNOSIS — Z131 Encounter for screening for diabetes mellitus: Secondary | ICD-10-CM | POA: Diagnosis not present

## 2023-05-20 DIAGNOSIS — Z1371 Encounter for nonprocreative screening for genetic disease carrier status: Secondary | ICD-10-CM | POA: Diagnosis not present

## 2023-05-20 DIAGNOSIS — Z3481 Encounter for supervision of other normal pregnancy, first trimester: Secondary | ICD-10-CM | POA: Diagnosis not present

## 2023-05-20 DIAGNOSIS — Z3A12 12 weeks gestation of pregnancy: Secondary | ICD-10-CM | POA: Diagnosis not present

## 2023-05-20 DIAGNOSIS — Z113 Encounter for screening for infections with a predominantly sexual mode of transmission: Secondary | ICD-10-CM | POA: Diagnosis not present

## 2023-06-19 DIAGNOSIS — Z3A17 17 weeks gestation of pregnancy: Secondary | ICD-10-CM | POA: Diagnosis not present

## 2023-06-19 DIAGNOSIS — Z3482 Encounter for supervision of other normal pregnancy, second trimester: Secondary | ICD-10-CM | POA: Diagnosis not present

## 2023-06-25 DIAGNOSIS — Z3A18 18 weeks gestation of pregnancy: Secondary | ICD-10-CM | POA: Diagnosis not present

## 2023-06-25 DIAGNOSIS — R109 Unspecified abdominal pain: Secondary | ICD-10-CM | POA: Diagnosis not present

## 2023-06-25 DIAGNOSIS — O99891 Other specified diseases and conditions complicating pregnancy: Secondary | ICD-10-CM | POA: Diagnosis not present

## 2023-07-08 DIAGNOSIS — Z363 Encounter for antenatal screening for malformations: Secondary | ICD-10-CM | POA: Diagnosis not present

## 2023-07-08 DIAGNOSIS — Z3A19 19 weeks gestation of pregnancy: Secondary | ICD-10-CM | POA: Diagnosis not present

## 2023-07-08 DIAGNOSIS — Z3482 Encounter for supervision of other normal pregnancy, second trimester: Secondary | ICD-10-CM | POA: Diagnosis not present

## 2023-07-08 DIAGNOSIS — Z1331 Encounter for screening for depression: Secondary | ICD-10-CM | POA: Diagnosis not present

## 2023-08-18 DIAGNOSIS — O411230 Chorioamnionitis, third trimester, not applicable or unspecified: Secondary | ICD-10-CM | POA: Diagnosis not present

## 2023-08-18 DIAGNOSIS — O42012 Preterm premature rupture of membranes, onset of labor within 24 hours of rupture, second trimester: Secondary | ICD-10-CM | POA: Diagnosis not present

## 2023-08-18 DIAGNOSIS — Z3A25 25 weeks gestation of pregnancy: Secondary | ICD-10-CM | POA: Diagnosis not present

## 2023-08-18 DIAGNOSIS — Z79899 Other long term (current) drug therapy: Secondary | ICD-10-CM | POA: Diagnosis not present

## 2023-08-22 DIAGNOSIS — R55 Syncope and collapse: Secondary | ICD-10-CM | POA: Diagnosis not present

## 2023-08-22 DIAGNOSIS — R42 Dizziness and giddiness: Secondary | ICD-10-CM | POA: Diagnosis not present

## 2023-08-22 DIAGNOSIS — S0990XA Unspecified injury of head, initial encounter: Secondary | ICD-10-CM | POA: Diagnosis not present

## 2023-08-22 DIAGNOSIS — D72825 Bandemia: Secondary | ICD-10-CM | POA: Diagnosis not present

## 2023-08-23 DIAGNOSIS — R42 Dizziness and giddiness: Secondary | ICD-10-CM | POA: Diagnosis not present

## 2023-08-23 DIAGNOSIS — S0990XA Unspecified injury of head, initial encounter: Secondary | ICD-10-CM | POA: Diagnosis not present

## 2023-09-03 DIAGNOSIS — Z1331 Encounter for screening for depression: Secondary | ICD-10-CM | POA: Diagnosis not present

## 2024-01-09 DIAGNOSIS — A64 Unspecified sexually transmitted disease: Secondary | ICD-10-CM | POA: Diagnosis not present

## 2024-01-09 DIAGNOSIS — Z202 Contact with and (suspected) exposure to infections with a predominantly sexual mode of transmission: Secondary | ICD-10-CM | POA: Diagnosis not present

## 2024-01-16 ENCOUNTER — Ambulatory Visit (HOSPITAL_BASED_OUTPATIENT_CLINIC_OR_DEPARTMENT_OTHER)
Admission: RE | Admit: 2024-01-16 | Discharge: 2024-01-16 | Disposition: A | Discharge status: Home / Self Care | Source: Ambulatory Visit | Attending: Certified Nurse Midwife | Admitting: Certified Nurse Midwife

## 2024-01-16 ENCOUNTER — Encounter (HOSPITAL_BASED_OUTPATIENT_CLINIC_OR_DEPARTMENT_OTHER): Payer: Self-pay | Admitting: Certified Nurse Midwife

## 2024-01-16 ENCOUNTER — Other Ambulatory Visit (HOSPITAL_COMMUNITY)
Admission: RE | Admit: 2024-01-16 | Discharge: 2024-01-16 | Disposition: A | Discharge status: Home / Self Care | Source: Ambulatory Visit | Attending: Certified Nurse Midwife | Admitting: Certified Nurse Midwife

## 2024-01-16 ENCOUNTER — Ambulatory Visit (INDEPENDENT_AMBULATORY_CARE_PROVIDER_SITE_OTHER): Admitting: Certified Nurse Midwife

## 2024-01-16 VITALS — BP 114/88 | HR 86 | Ht 64.0 in | Wt 157.8 lb

## 2024-01-16 DIAGNOSIS — R102 Pelvic and perineal pain: Secondary | ICD-10-CM

## 2024-01-16 DIAGNOSIS — Z124 Encounter for screening for malignant neoplasm of cervix: Secondary | ICD-10-CM | POA: Insufficient documentation

## 2024-01-16 DIAGNOSIS — Z7185 Encounter for immunization safety counseling: Secondary | ICD-10-CM

## 2024-01-16 DIAGNOSIS — Z113 Encounter for screening for infections with a predominantly sexual mode of transmission: Secondary | ICD-10-CM | POA: Diagnosis present

## 2024-01-16 LAB — CBC
Hematocrit: 43.2 % (ref 34.0–46.6)
Hemoglobin: 14.2 g/dL (ref 11.1–15.9)
MCH: 29.2 pg (ref 26.6–33.0)
MCHC: 32.9 g/dL (ref 31.5–35.7)
MCV: 89 fL (ref 79–97)
Platelets: 341 x10E3/uL (ref 150–450)
RBC: 4.86 x10E6/uL (ref 3.77–5.28)
RDW: 12.9 % (ref 11.7–15.4)
WBC: 5.2 x10E3/uL (ref 3.4–10.8)
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
## 2024-01-17 LAB — HEPATITIS C ANTIBODY: Hep C Virus Ab: NONREACTIVE

## 2024-01-17 LAB — HEPATITIS B SURFACE ANTIGEN: Hepatitis B Surface Ag: NEGATIVE

## 2024-01-17 LAB — HIV ANTIBODY (ROUTINE TESTING W REFLEX): HIV Screen 4th Generation wRfx: NONREACTIVE

## 2024-01-17 LAB — RPR: RPR Ser Ql: NONREACTIVE

## 2024-01-18 LAB — URINE CULTURE

## 2024-01-19 ENCOUNTER — Ambulatory Visit (HOSPITAL_BASED_OUTPATIENT_CLINIC_OR_DEPARTMENT_OTHER): Payer: Self-pay | Admitting: Certified Nurse Midwife

## 2024-01-19 LAB — CERVICOVAGINAL ANCILLARY ONLY
Bacterial Vaginitis (gardnerella): NEGATIVE
Candida Glabrata: NEGATIVE
Candida Vaginitis: NEGATIVE
Chlamydia: NEGATIVE
Comment: NEGATIVE
Comment: NEGATIVE
Comment: NEGATIVE
Comment: NEGATIVE
Comment: NEGATIVE
Comment: NORMAL
Neisseria Gonorrhea: NEGATIVE
Trichomonas: NEGATIVE

## 2024-01-21 LAB — CYTOLOGY - PAP
Chlamydia: NEGATIVE
Comment: NEGATIVE
Comment: NEGATIVE
Comment: NEGATIVE
Comment: NORMAL
Diagnosis: NEGATIVE
High risk HPV: NEGATIVE
Neisseria Gonorrhea: NEGATIVE
Trichomonas: NEGATIVE

## 2024-01-29 ENCOUNTER — Encounter (HOSPITAL_BASED_OUTPATIENT_CLINIC_OR_DEPARTMENT_OTHER): Payer: Self-pay

## 2024-01-29 ENCOUNTER — Ambulatory Visit (HOSPITAL_BASED_OUTPATIENT_CLINIC_OR_DEPARTMENT_OTHER): Admitting: Certified Nurse Midwife

## 2024-03-04 ENCOUNTER — Ambulatory Visit (HOSPITAL_BASED_OUTPATIENT_CLINIC_OR_DEPARTMENT_OTHER): Admitting: Certified Nurse Midwife

## 2024-03-04 ENCOUNTER — Encounter (HOSPITAL_BASED_OUTPATIENT_CLINIC_OR_DEPARTMENT_OTHER): Payer: Self-pay | Admitting: Certified Nurse Midwife

## 2024-03-04 ENCOUNTER — Other Ambulatory Visit (HOSPITAL_BASED_OUTPATIENT_CLINIC_OR_DEPARTMENT_OTHER): Payer: Self-pay

## 2024-03-04 VITALS — BP 125/73 | HR 83 | Wt 156.4 lb

## 2024-03-04 DIAGNOSIS — Z3202 Encounter for pregnancy test, result negative: Secondary | ICD-10-CM

## 2024-03-04 DIAGNOSIS — Z30430 Encounter for insertion of intrauterine contraceptive device: Secondary | ICD-10-CM

## 2024-03-04 DIAGNOSIS — Z30431 Encounter for routine checking of intrauterine contraceptive device: Secondary | ICD-10-CM

## 2024-03-04 LAB — POCT URINE PREGNANCY: Preg Test, Ur: NEGATIVE

## 2024-03-04 MED ORDER — FLUOXETINE HCL 10 MG PO CAPS
10.0000 mg | ORAL_CAPSULE | Freq: Every day | ORAL | 12 refills | Status: AC
  Filled 2024-03-04: qty 30, 30d supply, fill #0
  Filled 2024-04-08: qty 30, 30d supply, fill #1

## 2024-03-04 MED ORDER — PARAGARD INTRAUTERINE COPPER IU IUD
1.0000 | INTRAUTERINE_SYSTEM | Freq: Once | INTRAUTERINE | Status: AC
Start: 2024-03-04 — End: 2024-03-04
  Administered 2024-03-04: 1 via INTRAUTERINE

## 2024-03-04 NOTE — Addendum Note (Signed)
 Addended by: DARRYLE PRESIDENT B on: 03/04/2024 03:34 PM   Modules accepted: Orders

## 2024-03-04 NOTE — Progress Notes (Signed)
      GYNECOLOGY OFFICE PROCEDURE NOTE  Elizabeth [name] is a 33 y.o. [phone] here for Paragard   IUD insertion. No GYN concerns.  Last pap smear  was normal.  IUD Insertion Procedure Note Patient identified, informed consent performed, consent signed.   Discussed risks of irregular bleeding, cramping, infection, malpositioning or misplacement of the IUD outside the uterus which may require further procedure such as laparoscopy. Time out was performed.  Urine pregnancy test negative.  Speculum placed in the vagina.  Cervix visualized.  Cleaned with Betadine x 2.  Grasped anteriorly with a single tooth tenaculum.  Uterus sounded to 5 cm.  IUD placed per manufacturer's recommendations.  Strings trimmed to 3 cm. Tenaculum was removed, good hemostasis noted.  Patient tolerated procedure well.   Patient was given post-procedure instructions.  She was advised to have backup contraception for one week.  Patient was also asked to check IUD strings periodically and follow up in 4 weeks for IUD check.   Arland MARLA Roller, CNM 12:48 PM

## 2024-03-05 ENCOUNTER — Ambulatory Visit (HOSPITAL_BASED_OUTPATIENT_CLINIC_OR_DEPARTMENT_OTHER)
Admission: RE | Admit: 2024-03-05 | Discharge: 2024-03-05 | Disposition: A | Discharge status: Home / Self Care | Source: Ambulatory Visit | Attending: Certified Nurse Midwife | Admitting: Certified Nurse Midwife

## 2024-03-05 DIAGNOSIS — Z30431 Encounter for routine checking of intrauterine contraceptive device: Secondary | ICD-10-CM | POA: Insufficient documentation

## 2024-03-15 ENCOUNTER — Ambulatory Visit (HOSPITAL_BASED_OUTPATIENT_CLINIC_OR_DEPARTMENT_OTHER): Payer: Self-pay | Admitting: Certified Nurse Midwife

## 2024-04-19 ENCOUNTER — Other Ambulatory Visit (HOSPITAL_BASED_OUTPATIENT_CLINIC_OR_DEPARTMENT_OTHER): Payer: Self-pay

## 2024-05-20 ENCOUNTER — Encounter (HOSPITAL_BASED_OUTPATIENT_CLINIC_OR_DEPARTMENT_OTHER): Payer: Self-pay | Admitting: Obstetrics and Gynecology

## 2024-05-20 ENCOUNTER — Ambulatory Visit (HOSPITAL_BASED_OUTPATIENT_CLINIC_OR_DEPARTMENT_OTHER): Admitting: Obstetrics and Gynecology

## 2024-05-20 VITALS — BP 123/75 | HR 82 | Ht 64.0 in | Wt 163.8 lb

## 2024-05-20 DIAGNOSIS — Z30431 Encounter for routine checking of intrauterine contraceptive device: Secondary | ICD-10-CM

## 2024-05-20 NOTE — Progress Notes (Signed)
° °  GYNECOLOGY PROGRESS NOTE  History:  33 y.o. [id number] presents to [facility] for gyn visit. She cannot feel her IUD strings. Paragard  placed 03/04/24.  U/s 9/26 confirmed correct location  She cannot feel her strings  The following portions of the patient's history were reviewed and updated as appropriate: allergies, current medications, past family history, past medical history, past social history, past surgical history and problem list. Last pap smear on 01/16/24 was normal, neg HRHPV.  Health Maintenance Due  Topic Date Due   COVID-19 Vaccine (1) Never done   Hepatitis B Vaccines 19-59 Average Risk (1 of 3 - 19+ 3-dose series) Never done   HPV VACCINES (1 - 3-dose SCDM series) Never done   Influenza Vaccine  01/09/2024     Review of Systems:  Pertinent items are noted in HPI.   Objective:  Physical Exam currently breastfeeding. VS reviewed, nursing note reviewed,  Constitutional: well developed, well nourished, no distress HEENT: normocephalic Pulm/chest wall: normal effort Neuro: alert and oriented  Skin: warm, dry Psych: affect normal Pelvic exam: Cervix pink, visually closed, without lesion, vaginal walls and external genitalia normal. White IUD strings visualized   Assessment & Plan:  1. IUD check up (Primary) IUD strings visualized and appropriate No concerns with IUD otherwise  Follow up PRN    Teo Moede, FNP
